# Patient Record
Sex: Female | Born: 1995 | Race: Black or African American | Hispanic: No | Marital: Single | Smoking: Never smoker
Health system: Southern US, Community
[De-identification: ages and names within clinical notes are randomized; demographics above are authoritative.]

## PROBLEM LIST (undated history)

## (undated) DIAGNOSIS — B009 Herpesviral infection, unspecified: Secondary | ICD-10-CM

---

## 2015-02-05 ENCOUNTER — Encounter: Payer: Self-pay | Admitting: Emergency Medicine

## 2015-02-05 ENCOUNTER — Other Ambulatory Visit: Payer: Self-pay

## 2015-02-05 ENCOUNTER — Emergency Department
Admission: EM | Admit: 2015-02-05 | Discharge: 2015-02-05 | Disposition: A | Discharge status: Home / Self Care | Payer: Medicaid - Out of State | Attending: Emergency Medicine | Admitting: Emergency Medicine

## 2015-02-05 DIAGNOSIS — Z3202 Encounter for pregnancy test, result negative: Secondary | ICD-10-CM | POA: Diagnosis not present

## 2015-02-05 DIAGNOSIS — R42 Dizziness and giddiness: Secondary | ICD-10-CM | POA: Insufficient documentation

## 2015-02-05 HISTORY — DX: Herpesviral infection, unspecified: B00.9

## 2015-02-05 LAB — URINALYSIS COMPLETE WITH MICROSCOPIC (ARMC ONLY)
BILIRUBIN URINE: NEGATIVE
Glucose, UA: NEGATIVE mg/dL
HGB URINE DIPSTICK: NEGATIVE
KETONES UR: NEGATIVE mg/dL
LEUKOCYTES UA: NEGATIVE
Nitrite: NEGATIVE
PH: 6 (ref 5.0–8.0)
Protein, ur: NEGATIVE mg/dL
Specific Gravity, Urine: 1.025 (ref 1.005–1.030)

## 2015-02-05 LAB — CBC WITH DIFFERENTIAL/PLATELET
BASOS PCT: 1 %
Basophils Absolute: 0.1 10*3/uL (ref 0–0.1)
EOS ABS: 0 10*3/uL (ref 0–0.7)
EOS PCT: 0 %
HEMATOCRIT: 42.9 % (ref 35.0–47.0)
Hemoglobin: 14.2 g/dL (ref 12.0–16.0)
Lymphocytes Relative: 29 %
Lymphs Abs: 2.3 10*3/uL (ref 1.0–3.6)
MCH: 28 pg (ref 26.0–34.0)
MCHC: 33.1 g/dL (ref 32.0–36.0)
MCV: 84.7 fL (ref 80.0–100.0)
MONO ABS: 0.3 10*3/uL (ref 0.2–0.9)
MONOS PCT: 4 %
Neutro Abs: 5.3 10*3/uL (ref 1.4–6.5)
Neutrophils Relative %: 66 %
PLATELETS: 386 10*3/uL (ref 150–440)
RBC: 5.06 MIL/uL (ref 3.80–5.20)
RDW: 13.3 % (ref 11.5–14.5)
WBC: 8 10*3/uL (ref 3.6–11.0)

## 2015-02-05 LAB — COMPREHENSIVE METABOLIC PANEL
ALBUMIN: 4.3 g/dL (ref 3.5–5.0)
ALT: 10 U/L — ABNORMAL LOW (ref 14–54)
ANION GAP: 8 (ref 5–15)
AST: 18 U/L (ref 15–41)
Alkaline Phosphatase: 66 U/L (ref 38–126)
BILIRUBIN TOTAL: 0.5 mg/dL (ref 0.3–1.2)
BUN: 9 mg/dL (ref 6–20)
CO2: 23 mmol/L (ref 22–32)
Calcium: 9.4 mg/dL (ref 8.9–10.3)
Chloride: 107 mmol/L (ref 101–111)
Creatinine, Ser: 0.69 mg/dL (ref 0.44–1.00)
GFR calc Af Amer: >60 mL/min (ref >60)
GFR calc non Af Amer: >60 mL/min (ref >60)
GLUCOSE: 99 mg/dL (ref 65–99)
POTASSIUM: 4.1 mmol/L (ref 3.5–5.1)
Sodium: 138 mmol/L (ref 135–145)
TOTAL PROTEIN: 8.1 g/dL (ref 6.5–8.1)

## 2015-02-05 LAB — PREGNANCY, URINE: Preg Test, Ur: NEGATIVE

## 2015-02-05 MED ORDER — ONDANSETRON HCL 4 MG/2ML IJ SOLN
4.0000 mg | Freq: Once | INTRAMUSCULAR | Status: AC
  Administered 2015-02-05: 4 mg via INTRAVENOUS
  Filled 2015-02-05: qty 2

## 2015-02-05 MED ORDER — SODIUM CHLORIDE 0.9 % IV SOLN
Freq: Once | INTRAVENOUS | Status: AC
  Administered 2015-02-05: 11:00:00 via INTRAVENOUS

## 2015-02-05 NOTE — Discharge Instructions (Signed)

## 2015-02-05 NOTE — ED Provider Notes (Signed)
[facility] Emergency Department Provider Note     Time seen: ----------------------------------------- 10:23 AM on 02/05/2015 -----------------------------------------    I have reviewed the triage vital signs and the nursing notes.   HISTORY  Chief Complaint Emesis and Dizziness    HPI Paige Alexander is a 19 y.o. female who presents ER for vomiting and dizziness that started this morning when she woke up.Patient has not had a history of this before, missed her last menstrual cycle. By dizziness she means lightheadedness. Does not describe room spinning sensation. Vomiting started after the dizziness, no other complaints such as fever or diarrhea.   Past Medical History  Diagnosis Date  . HSV (herpes simplex virus) infection     There are no active problems to display for this patient.   History reviewed. No pertinent past surgical history.  Allergies Review of patient's allergies indicates no known allergies.  Social History Social History  Substance Use Topics  . Smoking status: Never Smoker   . Smokeless tobacco: None  . Alcohol Use: No   Review of Systems Constitutional: Negative for fever. Eyes: Negative for visual changes. ENT: Negative for sore throat. Cardiovascular: Negative for chest pain. Respiratory: Negative for shortness of breath. Gastrointestinal: Negative for abdominal pain, vomiting and diarrhea. Genitourinary: Negative for dysuria. Musculoskeletal: Negative for back pain. Skin: Negative for rash. Neurological: Negative for headaches, focal weakness or numbness.  10-point ROS otherwise negative.  ____________________________________________   PHYSICAL EXAM:  VITAL SIGNS: ED Triage Vitals  Enc Vitals Group     BP 02/05/15 1016 132/79 mmHg     Pulse Rate 02/05/15 1016 87     Resp 02/05/15 1016 20     Temp 02/05/15 1016 98.3 F (36.8 C)     Temp src --      SpO2 02/05/15 1016 99 %     Weight 02/05/15  1016 166 lb (75.297 kg)     Height 02/05/15 1016 5' (1.524 m)     Head Cir --      Peak Flow --      Pain Score 02/05/15 1017 0     Pain Loc --      Pain Edu? --      Excl. in GC? --     Constitutional: Alert and oriented. Well appearing and in no distress. Eyes: Conjunctivae are normal. PERRL. Normal extraocular movements. ENT   Head: Normocephalic and atraumatic.   Nose: No congestion/rhinnorhea.   Mouth/Throat: Mucous membranes are moist.   Neck: No stridor. Cardiovascular: Normal rate, regular rhythm. Normal and symmetric distal pulses are present in all extremities. No murmurs, rubs, or gallops. Respiratory: Normal respiratory effort without tachypnea nor retractions. Breath sounds are clear and equal bilaterally. No wheezes/rales/rhonchi. Gastrointestinal: Soft and nontender. No distention. No abdominal bruits.  Musculoskeletal: Nontender with normal range of motion in all extremities. No joint effusions.  No lower extremity tenderness nor edema. Neurologic:  Normal speech and language. No gross focal neurologic deficits are appreciated. Speech is normal. No gait instability. Skin:  Skin is warm, dry and intact. No rash noted. Psychiatric: Mood and affect are normal. Speech and behavior are normal. Patient exhibits appropriate insight and judgment. ____________________________________________  EKG: Interpreted by me. Normal sinus rhythm with a rate of 75 bpm, normal axis normal intervals. No evidence of hypertrophy or acute infarction.  ____________________________________________  ED COURSE:  Pertinent labs & imaging results that were available during my care of the patient were reviewed by me and considered in my medical  decision making (see chart for details). Patient's distress, will check basic labs and give a liter fluid bolus ____________________________________________    LABS (pertinent positives/negatives)  Labs Reviewed  COMPREHENSIVE METABOLIC  PANEL - Abnormal; Notable for the following:    ALT 10 (*)    All other components within normal limits  URINALYSIS COMPLETEWITH MICROSCOPIC ([facility] ONLY) - Abnormal; Notable for the following:    Color, Urine YELLOW (*)    APPearance CLEAR (*)    Bacteria, UA RARE (*)    Squamous Epithelial / LPF 0-5 (*)    All other components within normal limits  CBC WITH DIFFERENTIAL/PLATELET  PREGNANCY, URINE    ____________________________________________  FINAL ASSESSMENT AND PLAN  Dizziness  Plan: Patient with labs as dictated above. Labs are unremarkable, patient received a liter saline here. She is stable for outpatient follow-up with her doctor.   Emily Filbert, MD   Emily Filbert, MD 02/05/15 [phone]

## 2015-02-05 NOTE — ED Notes (Signed)
Pt to ed with c/o vomiting and dizziness that started this am when she awoke.  Pt alert and oriented and skin warm and dry.

## 2015-02-13 LAB — POCT PREGNANCY, URINE: PREG TEST UR: NEGATIVE

## 2015-03-15 ENCOUNTER — Encounter: Payer: Self-pay | Admitting: Emergency Medicine

## 2015-03-15 ENCOUNTER — Emergency Department
Admission: EM | Admit: 2015-03-15 | Discharge: 2015-03-15 | Disposition: A | Discharge status: Home / Self Care | Payer: Medicaid - Out of State | Attending: Emergency Medicine | Admitting: Emergency Medicine

## 2015-03-15 DIAGNOSIS — F329 Major depressive disorder, single episode, unspecified: Secondary | ICD-10-CM | POA: Insufficient documentation

## 2015-03-15 DIAGNOSIS — Z3202 Encounter for pregnancy test, result negative: Secondary | ICD-10-CM | POA: Insufficient documentation

## 2015-03-15 DIAGNOSIS — G4700 Insomnia, unspecified: Secondary | ICD-10-CM

## 2015-03-15 DIAGNOSIS — R112 Nausea with vomiting, unspecified: Secondary | ICD-10-CM | POA: Insufficient documentation

## 2015-03-15 DIAGNOSIS — R45851 Suicidal ideations: Secondary | ICD-10-CM | POA: Diagnosis present

## 2015-03-15 DIAGNOSIS — F321 Major depressive disorder, single episode, moderate: Secondary | ICD-10-CM

## 2015-03-15 DIAGNOSIS — F32A Depression, unspecified: Secondary | ICD-10-CM

## 2015-03-15 DIAGNOSIS — F4310 Post-traumatic stress disorder, unspecified: Secondary | ICD-10-CM

## 2015-03-15 LAB — COMPREHENSIVE METABOLIC PANEL
ALBUMIN: 4.5 g/dL (ref 3.5–5.0)
ALT: 11 U/L — AB (ref 14–54)
AST: 17 U/L (ref 15–41)
Alkaline Phosphatase: 85 U/L (ref 38–126)
Anion gap: 11 (ref 5–15)
BILIRUBIN TOTAL: 0.7 mg/dL (ref 0.3–1.2)
BUN: 8 mg/dL (ref 6–20)
CHLORIDE: 104 mmol/L (ref 101–111)
CO2: 22 mmol/L (ref 22–32)
Calcium: 9.5 mg/dL (ref 8.9–10.3)
Creatinine, Ser: 0.78 mg/dL (ref 0.44–1.00)
GFR calc Af Amer: >60 mL/min (ref >60)
GFR calc non Af Amer: >60 mL/min (ref >60)
GLUCOSE: 105 mg/dL — AB (ref 65–99)
POTASSIUM: 3.5 mmol/L (ref 3.5–5.1)
Sodium: 137 mmol/L (ref 135–145)
TOTAL PROTEIN: 8.1 g/dL (ref 6.5–8.1)

## 2015-03-15 LAB — URINE DRUG SCREEN, QUALITATIVE (ARMC ONLY)
AMPHETAMINES, UR SCREEN: NOT DETECTED
Barbiturates, Ur Screen: NOT DETECTED
Benzodiazepine, Ur Scrn: NOT DETECTED
COCAINE METABOLITE, UR ~~LOC~~: NOT DETECTED
Cannabinoid 50 Ng, Ur ~~LOC~~: NOT DETECTED
MDMA (ECSTASY) UR SCREEN: NOT DETECTED
Methadone Scn, Ur: NOT DETECTED
OPIATE, UR SCREEN: NOT DETECTED
PHENCYCLIDINE (PCP) UR S: NOT DETECTED
Tricyclic, Ur Screen: NOT DETECTED

## 2015-03-15 LAB — ACETAMINOPHEN LEVEL: Acetaminophen (Tylenol), Serum: 22 ug/mL (ref 10–30)

## 2015-03-15 LAB — CBC
HEMATOCRIT: 40.9 % (ref 35.0–47.0)
Hemoglobin: 13.4 g/dL (ref 12.0–16.0)
MCH: 27.7 pg (ref 26.0–34.0)
MCHC: 32.8 g/dL (ref 32.0–36.0)
MCV: 84.6 fL (ref 80.0–100.0)
Platelets: 456 10*3/uL — ABNORMAL HIGH (ref 150–440)
RBC: 4.83 MIL/uL (ref 3.80–5.20)
RDW: 13.1 % (ref 11.5–14.5)
WBC: 7.5 10*3/uL (ref 3.6–11.0)

## 2015-03-15 LAB — POCT PREGNANCY, URINE: Preg Test, Ur: NEGATIVE

## 2015-03-15 LAB — ETHANOL: Alcohol, Ethyl (B): <5 mg/dL (ref <5)

## 2015-03-15 LAB — SALICYLATE LEVEL: Salicylate Lvl: <4 mg/dL (ref 2.8–30.0)

## 2015-03-15 MED ORDER — DIAZEPAM 5 MG PO TABS
5.0000 mg | ORAL_TABLET | Freq: Once | ORAL | Status: AC
  Administered 2015-03-15: 5 mg via ORAL
  Filled 2015-03-15: qty 1

## 2015-03-15 MED ORDER — TRAZODONE HCL 50 MG PO TABS: 50.0000 mg | ORAL_TABLET | Freq: Every evening | ORAL | Status: DC | PRN

## 2015-03-15 NOTE — ED Notes (Signed)

## 2015-03-15 NOTE — ED Provider Notes (Signed)
[facility] Emergency Department Provider Note     Time seen:----------------------------------------- 12:27 PM on 03/15/2015 -----------------------------------------    I have reviewed the triage vital signs and the nursing notes.   HISTORY  Chief Complaint sucicidal ideations  and Emesis    HPI Paige Alexander is a 19 y.o. female who presents ER for multiple complaints. Patient states she was vomiting this morning and having blurry vision. He may have been blood in her vomit, triage she noted that she was having suicidal thoughts because her significant other was having a child with another woman and was going to leave her. She was feeling suicidal but had not made a specific plan. She is very tearful.   Past Medical History  Diagnosis Date  . HSV (herpes simplex virus) infection     There are no active problems to display for this patient.   History reviewed. No pertinent past surgical history.  Allergies Review of patient's allergies indicates no known allergies.  Social History Social History  Substance Use Topics  . Smoking status: Never Smoker   . Smokeless tobacco: None  . Alcohol Use: No    Review of Systems Constitutional: Negative for fever. Eyes: Negative for visual changes. ENT: Negative for sore throat. Cardiovascular: Negative for chest pain. Respiratory: Negative for shortness of breath. Gastrointestinal: Negative for abdominal pain, positive for vomiting Genitourinary: Negative for dysuria. Musculoskeletal: Negative for back pain. Skin: Negative for rash. Neurological: Negative for headaches, focal weakness or numbness. Psychiatric: Positive for suicidal thought, depression  10-point ROS otherwise negative.  ____________________________________________   PHYSICAL EXAM:  VITAL SIGNS: ED Triage Vitals  Enc Vitals Group     BP --      Pulse Rate 03/15/15 1206 94     Resp 03/15/15 1206 18     Temp 03/15/15  1206 98.5 F (36.9 C)     Temp Source 03/15/15 1206 Oral     SpO2 03/15/15 1206 96 %     Weight 03/15/15 1206 164 lb (74.39 kg)     Height 03/15/15 1206 5' (1.524 m)     Head Cir --      Peak Flow --      Pain Score --      Pain Loc --      Pain Edu? --      Excl. in GC? --     Constitutional: Alert and oriented. Patient very tearful and upset. Eyes: Conjunctivae are normal. PERRL. Normal extraocular movements. ENT   Head: Normocephalic and atraumatic.   Nose: No congestion/rhinnorhea.   Mouth/Throat: Mucous membranes are moist.   Neck: No stridor. Cardiovascular: Normal rate, regular rhythm. Normal and symmetric distal pulses are present in all extremities. No murmurs, rubs, or gallops. Respiratory: Normal respiratory effort without tachypnea nor retractions. Breath sounds are clear and equal bilaterally. No wheezes/rales/rhonchi. Gastrointestinal: Soft and nontender. No distention. No abdominal bruits.  Musculoskeletal: Nontender with normal range of motion in all extremities. No joint effusions.  No lower extremity tenderness nor edema. Neurologic:  Normal speech and language. No gross focal neurologic deficits are appreciated. Speech is normal. No gait instability. Skin:  Skin is warm, dry and intact. No rash noted. Psychiatric: Mood and affect are normal. Speech and behavior are normal. Patient exhibits appropriate insight and judgment.  ____________________________________________  ED COURSE:  Pertinent labs & imaging results that were available during my care of the patient were reviewed by me and considered in my medical decision making (see chart for details). Patient  appears to be having adjustment disorder with depressed mood. She being given some by mouth Valium at this time we'll reevaluate. ____________________________________________    LABS (pertinent positives/negatives)  Labs Reviewed  CBC - Abnormal; Notable for the following:    Platelets 456  (*)    All other components within normal limits  COMPREHENSIVE METABOLIC PANEL  ETHANOL  SALICYLATE LEVEL  ACETAMINOPHEN LEVEL  URINE DRUG SCREEN, QUALITATIVE ([facility] ONLY)  POCT PREGNANCY, URINE   ___________________________________________  FINAL ASSESSMENT AND PLAN  Suicidal ideation  Plan: Patient with labs and imaging as dictated above. Patient is feeling better, she been seen by psychiatrist will place her on trazodone. She is not suicidal or homicidal this time. She is stable for discharge. She is being referred as an outpatient   Emily Filbert, MD   Emily Filbert, MD 03/15/15 [phone]

## 2015-03-15 NOTE — ED Notes (Signed)
BEHAVIORAL HEALTH ROUNDING Patient sleeping: No. Patient alert and oriented: yes Behavior appropriate: Yes.  ; If no, describe:  Nutrition and fluids offered: Yes  Toileting and hygiene offered: Yes  Sitter present: not applicable Law enforcement present: Yes  

## 2015-03-15 NOTE — ED Notes (Signed)
Pt discharged home after verbalizing understanding of discharge instructions; nad noted. 

## 2015-03-15 NOTE — ED Notes (Signed)
BEHAVIORAL HEALTH ROUNDING Patient sleeping: No. Patient alert and oriented: yes Behavior appropriate: Yes.  ; If no, describe:  Nutrition and fluids offered: Yes  Toileting and hygiene offered: Yes  Sitter present: not applicable Law enforcement present: No   

## 2015-03-15 NOTE — Discharge Instructions (Signed)
Depression °Depression refers to feeling sad, low, down in the dumps, blue, gloomy, or empty. In general, there are two kinds of depression: °· Normal sadness or normal grief. This kind of depression is one that we all feel from time to time after upsetting life experiences, such as the loss of a job or the ending of a relationship. This kind of depression is considered normal, is short lived, and resolves within a few days to 2 weeks. Depression experienced after the loss of a loved one (bereavement) often lasts longer than 2 weeks but normally gets better with time. °· Clinical depression. This kind of depression lasts longer than normal sadness or normal grief or interferes with your ability to function at home, at work, and in school. It also interferes with your personal relationships. It affects almost every aspect of your life. Clinical depression is an illness. °Symptoms of depression can also be caused by conditions other than those mentioned above, such as: °· Physical illness. Some physical illnesses, including underactive thyroid gland (hypothyroidism), severe anemia, specific types of cancer, diabetes, uncontrolled seizures, heart and lung problems, strokes, and chronic pain are commonly associated with symptoms of depression. °· Side effects of some prescription medicine. In some people, certain types of medicine can cause symptoms of depression. °· Substance abuse. Abuse of alcohol and illicit drugs can cause symptoms of depression. °SYMPTOMS °Symptoms of normal sadness and normal grief include the following: °· Feeling sad or crying for short periods of time. °· Not caring about anything (apathy). °· Difficulty sleeping or sleeping too much. °· No longer able to enjoy the things you used to enjoy. °· Desire to be by oneself all the time (social isolation). °· Lack of energy or motivation. °· Difficulty concentrating or remembering. °· Change in appetite or weight. °· Restlessness or  agitation. °Symptoms of clinical depression include the same symptoms of normal sadness or normal grief and also the following symptoms: °· Feeling sad or crying all the time. °· Feelings of guilt or worthlessness. °· Feelings of hopelessness or helplessness. °· Thoughts of suicide or the desire to harm yourself (suicidal ideation). °· Loss of touch with reality (psychotic symptoms). Seeing or hearing things that are not real (hallucinations) or having false beliefs about your life or the people around you (delusions and paranoia). °DIAGNOSIS  °The diagnosis of clinical depression is usually based on how bad the symptoms are and how long they have lasted. Your health care provider will also ask you questions about your medical history and substance use to find out if physical illness, use of prescription medicine, or substance abuse is causing your depression. Your health care provider may also order blood tests. °TREATMENT  °Often, normal sadness and normal grief do not require treatment. However, sometimes antidepressant medicine is given for bereavement to ease the depressive symptoms until they resolve. °The treatment for clinical depression depends on how bad the symptoms are but often includes antidepressant medicine, counseling with a mental health professional, or both. Your health care provider will help to determine what treatment is best for you. °Depression caused by physical illness usually goes away with appropriate medical treatment of the illness. If prescription medicine is causing depression, talk with your health care provider about stopping the medicine, decreasing the dose, or changing to another medicine. °Depression caused by the abuse of alcohol or illicit drugs goes away when you stop using these substances. Some adults need professional help in order to stop drinking or using drugs. °SEEK IMMEDIATE MEDICAL   CARE IF: °· You have thoughts about hurting yourself or others. °· You lose touch  with reality (have psychotic symptoms). °· You are taking medicine for depression and have a serious side effect. °FOR MORE INFORMATION °· National Alliance on Mental Illness: www.nami.org  °· National Institute of Mental Health: www.nimh.nih.gov  °Document Released: 05/26/2000 Document Revised: 10/13/2013 Document Reviewed: 08/28/2011 °ExitCare® Patient Information ©2015 [employer]. This information is not intended to replace advice given to you by your health care provider. Make sure you discuss any questions you have with your health care provider. ° °Suicidal Feelings, How to Help Yourself °Everyone feels sad or unhappy at times, but depressing thoughts and feelings of hopelessness can lead to thoughts of suicide. It can seem as if life is too tough to handle. If you feel as though you have reached the point where suicide is the only answer, it is time to let someone know immediately.  °HOW TO COPE AND PREVENT SUICIDE °· Let family, friends, teachers, or counselors know. Get help. Try not to isolate yourself from those who care about you. Even though you may not feel sociable, talk with someone every day. It is best if it is face-to-face. Remember, they will want to help you. °· Eat a regularly spaced and well-balanced diet. °· Get plenty of rest. °· Avoid alcohol and drugs because they will only make you feel worse and may also lower your inhibitions. Remove them from the home. If you are thinking of taking an overdose of your prescribed medicines, give your medicines to someone who can give them to you one day at a time. If you are on antidepressants, let your caregiver know of your feelings so he or she can provide a safer medicine, if that is a concern. °· Remove weapons or poisons from your home. °· Try to stick to routines. Follow a schedule and remind yourself that you have to keep that schedule every day. °· Set some realistic goals and achieve them. Make a list and cross things off as you go.  Accomplishments give a sense of worth. Wait until you are feeling better before doing things you find difficult or unpleasant to do. °· If you are able, try to start exercising. Even half-hour periods of exercise each day will make you feel better. Getting out in the sun or into nature helps you recover from depression faster. If you have a favorite place to walk, take advantage of that. °· Increase safe activities that have always given you pleasure. This may include playing your favorite music, reading a good book, painting a picture, or playing your favorite instrument. Do whatever takes your mind off your depression. °· Keep your living space well-lighted. °GET HELP °Contact a suicide hotline, crisis center, or local suicide prevention center for help right away. Local centers may include a hospital, clinic, community service organization, social service provider, or health department. °· Call your local emergency services (911 in the [place]). °· Call a suicide hotline: °¨ 1-800-273-TALK (1-800-273-8255) in the [place]. °¨ 1-800-SUICIDE (1-800-784-2433) in the [place]. °¨ 1-888-628-9454 in the [place] for Spanish-speaking counselors. °¨ 1-800-799-4TTY (1-800-799-4889) in the [place] for TTY users. °· Visit the following websites for information and help: °¨ National Suicide Prevention Lifeline: www.suicidepreventionlifeline.org °¨ Hopeline: www.hopeline.com °¨ American Foundation for Suicide Prevention: www.afsp.org °· For lesbian, gay, bisexual, transgender, or questioning youth, contact [employer]: °¨ 1-866-4-U-TREVOR (1-866-488-7386) in the [place]. °¨ www.thetrevorproject.org °· In [place], treatment resources are listed in each   province with listings available under [employer] or similar titles. Another source for Crisis Centres by Province is located at  http://www.suicideprevention.ca/in-crisis-now/find-a-crisis-centre-now/crisis-centres °Document Released: 12/03/2002 Document Revised: 08/21/2011 Document Reviewed: 09/23/2013 °ExitCare® Patient Information ©2015 [employer]. This information is not intended to replace advice given to you by your health care provider. Make sure you discuss any questions you have with your health care provider. ° °
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
## 2015-03-15 NOTE — ED Notes (Signed)
Patient calmer, denies SI, states would like to speak to MD re reeval for discharge.

## 2015-03-15 NOTE — ED Notes (Addendum)
Pt states she has been vomiting blood since this am and she is having some "blurred vision", denies any pain, states she also had some diarrhea last night, while in triage pt verbalizes SI with no plan, pt has child with her present, calling family to get child at this time

## 2015-03-15 NOTE — ED Notes (Signed)
Pt signature did not take due to error in [facility]. Had pt sign copy of discharge papers.

## 2016-04-17 ENCOUNTER — Encounter: Payer: Self-pay | Admitting: Emergency Medicine

## 2016-04-17 ENCOUNTER — Emergency Department
Admission: EM | Admit: 2016-04-17 | Discharge: 2016-04-17 | Disposition: A | Discharge status: Home / Self Care | Payer: PRIVATE HEALTH INSURANCE | Attending: Emergency Medicine | Admitting: Emergency Medicine

## 2016-04-17 DIAGNOSIS — N10 Acute tubulo-interstitial nephritis: Secondary | ICD-10-CM

## 2016-04-17 DIAGNOSIS — Z79899 Other long term (current) drug therapy: Secondary | ICD-10-CM | POA: Insufficient documentation

## 2016-04-17 LAB — URINALYSIS COMPLETE WITH MICROSCOPIC (ARMC ONLY)
Bacteria, UA: NONE SEEN
Bilirubin Urine: NEGATIVE
GLUCOSE, UA: NEGATIVE mg/dL
Ketones, ur: NEGATIVE mg/dL
Nitrite: NEGATIVE
Protein, ur: 30 mg/dL — AB
SPECIFIC GRAVITY, URINE: 1.015 (ref 1.005–1.030)
pH: 6 (ref 5.0–8.0)

## 2016-04-17 LAB — POCT PREGNANCY, URINE: PREG TEST UR: NEGATIVE

## 2016-04-17 MED ORDER — CEPHALEXIN 500 MG PO CAPS
500.0000 mg | ORAL_CAPSULE | Freq: Once | ORAL | Status: AC
  Administered 2016-04-17: 500 mg via ORAL
  Filled 2016-04-17: qty 1

## 2016-04-17 MED ORDER — CEPHALEXIN 500 MG PO CAPS: 500.0000 mg | ORAL_CAPSULE | Freq: Four times a day (QID) | ORAL | 0 refills | Status: DC

## 2016-04-17 NOTE — ED Provider Notes (Signed)
[facility] Emergency Department Provider Note   ____________________________________________   First MD Initiated Contact with Patient 04/17/16 2110     (approximate)  I have reviewed the triage vital signs and the nursing notes.   HISTORY  Chief Complaint Back Pain    HPI Paige Alexander is a 20 y.o. female reports that she's been having discomfort in her left lower back for the last 2 days, also began feeling increased urinary frequency but in small amounts any burning discomfort with urination. She reports she's been trying cranberry but is not assisted her, comes today feels as though she has a "urinary tract infection".  Denies significant prior medical history. She doesn't history of HSV and takes bicycle earlier for suppression at this time only.  Denies pregnancy vaginal discharge or abdominal pain. No nausea or vomiting though she did felt slight chills today. Currently reports a mild to moderate left-sided discomfort in the back "kidney area".  No chest pain or shortness of breath  Past Medical History:  Diagnosis Date  . HSV (herpes simplex virus) infection     Patient Active Problem List   Diagnosis Date Noted  . Depression, major, single episode, moderate ([facility]) 03/15/2015  . PTSD (post-traumatic stress disorder) 03/15/2015  . Insomnia 03/15/2015    History reviewed. No pertinent surgical history.  Prior to Admission medications   Medication Sig Start Date End Date Taking? Authorizing Provider  acyclovir (ZOVIRAX) 400 MG tablet Take 400 mg by mouth 2 (two) times daily.    Historical Provider, MD  cephALEXin (KEFLEX) 500 MG capsule Take 1 capsule (500 mg total) by mouth 4 (four) times daily. 04/17/16   Sharyn CreamerMark Teofila Bowery, MD  drospirenone-ethinyl estradiol (ZARAH) 3-0.03 MG tablet Take 1 tablet by mouth daily.    Historical Provider, MD  traZODone (DESYREL) 50 MG tablet Take 1 tablet (50 mg total) by mouth at bedtime as needed for sleep.  03/15/15   Audery AmelJohn T Clapacs, MD    Allergies Patient has no known allergies.  No family history on file.  Social History Social History  Substance Use Topics  . Smoking status: Never Smoker  . Smokeless tobacco: Never Used  . Alcohol use No    Review of Systems Constitutional: No fever. No weakness Eyes: No visual changes. ENT: No sore throat. Cardiovascular: Denies chest pain. Respiratory: Denies shortness of breath. Gastrointestinal: No abdominal pain.  No nausea, no vomiting.  No diarrhea.  No constipation. Genitourinary: See history of present illness Musculoskeletal: Negative for back pain except over the left flank. Skin: Negative for rash. Neurological: Negative for headaches, focal weakness or numbness.  10-point ROS otherwise negative.  ____________________________________________   PHYSICAL EXAM:  VITAL SIGNS: ED Triage Vitals  Enc Vitals Group     BP 04/17/16 2027 130/82     Pulse Rate 04/17/16 2027 99     Resp 04/17/16 2027 18     Temp 04/17/16 2027 98.1 F (36.7 C)     Temp Source 04/17/16 2027 Oral     SpO2 04/17/16 2027 99 %     Weight 04/17/16 2027 173 lb (78.5 kg)     Height 04/17/16 2027 5' (1.524 m)     Head Circumference --      Peak Flow --      Pain Score 04/17/16 2033 9     Pain Loc --      Pain Edu? --      Excl. in GC? --     Constitutional: Alert and  oriented. Well appearing and in no acute distress. Eyes: Conjunctivae are normal. PERRL. EOMI. Head: Atraumatic. Nose: No congestion/rhinnorhea. Mouth/Throat: Mucous membranes are moist. Neck: No stridor.   Cardiovascular: Normal rate, regular rhythm. Grossly normal heart sounds.  Good peripheral circulation. Respiratory: Normal respiratory effort.  No retractions. Lungs CTAB. Gastrointestinal: Soft and nontender. No distention. No pain at McBurney's point. Negative Murphy. Patient does have mild left CVA tenderness, no CVA tenderness on the right. Mild discomfort to palpation  suprapubically Musculoskeletal: No lower extremity tenderness nor edema.   Neurologic:  Normal speech and language. No gross focal neurologic deficits are appreciated. No gait instability. Skin:  Skin is warm, dry and intact. No rash noted. Psychiatric: Mood and affect are normal. Speech and behavior are normal.  ____________________________________________   LABS (all labs ordered are listed, but only abnormal results are displayed)  Labs Reviewed  URINALYSIS COMPLETEWITH MICROSCOPIC ([facility] ONLY) - Abnormal; Notable for the following:       Result Value   Color, Urine YELLOW (*)    APPearance CLOUDY (*)    Hgb urine dipstick 1+ (*)    Protein, ur 30 (*)    Leukocytes, UA 3+ (*)    Squamous Epithelial / LPF 0-5 (*)    All other components within normal limits  URINE CULTURE  POC URINE PREG, ED  POCT PREGNANCY, URINE   ____________________________________________  EKG   ____________________________________________  RADIOLOGY   ____________________________________________   PROCEDURES  Procedure(s) performed: None  Procedures  Critical Care performed: No  ____________________________________________   INITIAL IMPRESSION / ASSESSMENT AND PLAN / ED COURSE  Pertinent labs & imaging results that were available during my care of the patient were reviewed by me and considered in my medical decision making (see chart for details).  Awake alert nontoxic. Dysuria, left flank pain, clinical examination history and urinalysis suggest urinary tract infection and trouble mild left-sided pyelonephritis. No significant risk factors for immunosuppression.  Hemodynamically stable, nontoxic and well-appearing. Appears appropriate for outpatient antibiotic. Discussed with the patient, she is very agreeable we'll treat with cephalexin. Recommended close follow-up, and carefully advised on return precautions.  Return precautions and treatment recommendations and follow-up discussed  with the patient who is agreeable with the plan.   Clinical Course      ____________________________________________   FINAL CLINICAL IMPRESSION(S) / ED DIAGNOSES  Final diagnoses:  Pyelonephritis, acute      NEW MEDICATIONS STARTED DURING THIS VISIT:  New Prescriptions   CEPHALEXIN (KEFLEX) 500 MG CAPSULE    Take 1 capsule (500 mg total) by mouth 4 (four) times daily.     Note:  This document was prepared using Dragon voice recognition software and may include unintentional dictation errors.     Sharyn CreamerMark Huston Stonehocker, MD 04/17/16 2216

## 2016-04-17 NOTE — ED Triage Notes (Signed)
Pt ambulatory to triage with steady gait with c/o lower back pain since this morning. Pt reports she believes she had UTI for 2-3 days and is taking cranberry pills for symptoms. Pt reports increased urinary frequency and dysuria. Pt denies nausea,vomiting, or hematuria.

## 2016-04-17 NOTE — Discharge Instructions (Signed)
Your workup today suggests that you have a urinary tract infection (UTI) which has spread to your kidneys.  Please take your antibiotic as prescribed and over-the-counter pain medication (Tylenol or Motrin) as needed, but no more than recommended on the label instructions.  Drink PLENTY of fluids.  Call your regular doctor to schedule the next available appointment to follow up on today's ED visit, or return immediately to the ED if your pain worsens, you have decreased urine production, develop fever, persistent vomiting, or other symptoms that concern you.  

## 2016-04-17 NOTE — ED Notes (Signed)
Pt assessed by Dr. Fanny BienQuale.

## 2016-04-20 LAB — URINE CULTURE
Culture: >=100000 — AB
SPECIAL REQUESTS: NORMAL

## 2016-04-21 NOTE — Progress Notes (Addendum)
ED Culture Results   Allergies: NKDA Visit Date: 04/17/16 Chief Complaint: back pain Culture Type: Urine Culture Results: >100,000 staphylococcus species (coagulase negative) Original Abx given: Cephalexin Original Abx sensitive, intermediate, or resistant: resistant to oxacillin Recommended Abx: Bactrim DS 1 tablet BID x7 days ED Physician: Phineas SemenGraydon Goodman Contacted Patient: Called patient at 11:26AM. No voicemail is set up on patients phone. Will have to call back at a later time.  Prescription Called into:   Rella LarveKelly Fuhrmann, PharmD Clinical Pharmacist- Resident 11:13 AM 04/21/2016

## 2016-07-20 ENCOUNTER — Emergency Department
Admission: EM | Admit: 2016-07-20 | Discharge: 2016-07-20 | Disposition: A | Discharge status: Home / Self Care | Payer: Medicaid - Out of State | Attending: Emergency Medicine | Admitting: Emergency Medicine

## 2016-07-20 DIAGNOSIS — N898 Other specified noninflammatory disorders of vagina: Secondary | ICD-10-CM | POA: Diagnosis present

## 2016-07-20 DIAGNOSIS — A5901 Trichomonal vulvovaginitis: Secondary | ICD-10-CM | POA: Diagnosis not present

## 2016-07-20 LAB — URINALYSIS, COMPLETE (UACMP) WITH MICROSCOPIC
BILIRUBIN URINE: NEGATIVE
Glucose, UA: NEGATIVE mg/dL
Hgb urine dipstick: NEGATIVE
KETONES UR: 5 mg/dL — AB
Nitrite: NEGATIVE
Protein, ur: 30 mg/dL — AB
SPECIFIC GRAVITY, URINE: 1.026 (ref 1.005–1.030)
pH: 5 (ref 5.0–8.0)

## 2016-07-20 LAB — WET PREP, GENITAL
Clue Cells Wet Prep HPF POC: NONE SEEN
Sperm: NONE SEEN
Yeast Wet Prep HPF POC: NONE SEEN

## 2016-07-20 LAB — POCT PREGNANCY, URINE: PREG TEST UR: NEGATIVE

## 2016-07-20 LAB — CHLAMYDIA/NGC RT PCR (ARMC ONLY)
CHLAMYDIA TR: NOT DETECTED
N GONORRHOEAE: NOT DETECTED

## 2016-07-20 MED ORDER — METRONIDAZOLE 500 MG PO TABS: 500.0000 mg | ORAL_TABLET | Freq: Two times a day (BID) | ORAL | 0 refills | Status: DC

## 2016-07-20 NOTE — ED Triage Notes (Signed)
Pt arrives to ER via POV c/o possible STD exposure. Pt reports discharge and foul odor. Pt reports that she thinks she may have gotten something from her husband. No pain.

## 2016-07-20 NOTE — Discharge Instructions (Signed)
Follow-up with [facility] for any continued sexually transmitted infections. Take Flagyl as directed. You cannot drink or take cough medication that contains alcohol while taking this medication.  Gonorrhea and chlamydia test are still pending at this time. You'll be informed if there is any positives. Have your partner tested at the health department.

## 2016-07-20 NOTE — ED Provider Notes (Signed)
[facility] Emergency Department Provider Note  ____________________________________________   First MD Initiated Contact with Patient 07/20/16 [phone]     (approximate)  I have reviewed the triage vital signs and the nursing notes.   HISTORY  Chief Complaint Vaginal Discharge (odor)    HPI Paige Alexander is a 21 y.o. female is here complaining of vaginal discharge with odor for approximately one week. Patient states that most likely she does have an STD exposure. She states that she is gotten infections from her husband in the past. She denies any nausea, vomiting, fever or chills. She is unaware that her partner has any symptoms at this time. Patient states that last time she was treated at another hospital and was positive for gonorrhea and  chlamydia. She states that her husband says he was treated.   Past Medical History:  Diagnosis Date  . HSV (herpes simplex virus) infection     Patient Active Problem List   Diagnosis Date Noted  . Depression, major, single episode, moderate (HCC) 03/15/2015  . PTSD (post-traumatic stress disorder) 03/15/2015  . Insomnia 03/15/2015    History reviewed. No pertinent surgical history.  Prior to Admission medications   Medication Sig Start Date End Date Taking? Authorizing Provider  acyclovir (ZOVIRAX) 400 MG tablet Take 400 mg by mouth 2 (two) times daily.    Historical Provider, MD  drospirenone-ethinyl estradiol (ZARAH) 3-0.03 MG tablet Take 1 tablet by mouth daily.    Historical Provider, MD  metroNIDAZOLE (FLAGYL) 500 MG tablet Take 1 tablet (500 mg total) by mouth 2 (two) times daily. 07/20/16   Tommi Rumpshonda L Merrillyn Ackerley, PA-C  traZODone (DESYREL) 50 MG tablet Take 1 tablet (50 mg total) by mouth at bedtime as needed for sleep. 03/15/15   Audery AmelJohn T Clapacs, MD    Allergies Patient has no known allergies.  No family history on file.  Social History Social History  Substance Use Topics  . Smoking status: Never  Smoker  . Smokeless tobacco: Never Used  . Alcohol use No    Review of Systems Constitutional: No fever/chills Cardiovascular: Denies chest pain. Respiratory: Denies shortness of breath. Gastrointestinal: No abdominal pain.  No nausea, no vomiting.   Genitourinary: Positive for dysuria. Positive for vaginal discharge. Musculoskeletal: Negative for back pain. Skin: Negative for rash. Neurological: Negative for headaches, focal weakness or numbness. Psychiatric:Positive major depression. Positive PTSD.  10-point ROS otherwise negative.  ____________________________________________   PHYSICAL EXAM:  VITAL SIGNS: ED Triage Vitals [07/20/16 0841]  Enc Vitals Group     BP 117/64     Pulse Rate 69     Resp 18     Temp 98.3 F (36.8 C)     Temp Source Oral     SpO2 100 %     Weight 150 lb (68 kg)     Height 5' (1.524 m)     Head Circumference      Peak Flow      Pain Score      Pain Loc      Pain Edu?      Excl. in GC?     Constitutional: Alert and oriented. Well appearing and in no acute distress. Eyes: Conjunctivae are normal. PERRL. EOMI. Head: Atraumatic. Nose: No congestion/rhinnorhea. Neck: No stridor.   Cardiovascular: Normal rate, regular rhythm. Grossly normal heart sounds.  Good peripheral circulation. Respiratory: Normal respiratory effort.  No retractions. Lungs CTAB. Gastrointestinal: Soft and nontender. No distention.  Genitourinary: On vaginal exam there is moderate) secretions  noted. There is no tenderness or masses noted bilateral adnexal areas. There is no tenderness on cervical motion. Musculoskeletal: Moves upper and lower extremities without difficulty. Normal gait was noted. Neurologic:  Normal speech and language. No gross focal neurologic deficits are appreciated. No gait instability. Skin:  Skin is warm, dry and intact. No rash noted. Psychiatric: Mood and affect are normal. Speech and behavior are  normal.  ____________________________________________   LABS (all labs ordered are listed, but only abnormal results are displayed)  Labs Reviewed  WET PREP, GENITAL - Abnormal; Notable for the following:       Result Value   Trich, Wet Prep PRESENT (*)    WBC, Wet Prep HPF POC MODERATE (*)    All other components within normal limits  URINALYSIS, COMPLETE (UACMP) WITH MICROSCOPIC - Abnormal; Notable for the following:    Color, Urine YELLOW (*)    APPearance CLOUDY (*)    Ketones, ur 5 (*)    Protein, ur 30 (*)    Leukocytes, UA LARGE (*)    Bacteria, UA RARE (*)    Squamous Epithelial / LPF 6-30 (*)    All other components within normal limits  CHLAMYDIA/NGC RT PCR ([facility] ONLY)  POC URINE PREG, ED  POCT PREGNANCY, URINE    PROCEDURES  Procedure(s) performed: None  Procedures  Critical Care performed: No  ____________________________________________   INITIAL IMPRESSION / ASSESSMENT AND PLAN / ED COURSE  Pertinent labs & imaging results that were available during my care of the patient were reviewed by me and considered in my medical decision making (see chart for details).  Patient was made aware that she does have Trichomonas on her wet prep. She is given a prescription for Flagyl 500 mg to take as directed. At time of discharge her Chlamydia and gonorrhea test was not available. Patient was made aware that if these are positive that she will be called. At this time we are only treating for the Trichomonas. She was given [facility] for both she and her husband to follow-up with if any continued      ____________________________________________   FINAL CLINICAL IMPRESSION(S) / ED DIAGNOSES  Final diagnoses:  Vaginitis due to Trichomonas      NEW MEDICATIONS STARTED DURING THIS VISIT:  Discharge Medication List as of 07/20/2016  9:57 AM    START taking these medications   Details  metroNIDAZOLE  (FLAGYL) 500 MG tablet Take 1 tablet (500 mg total) by mouth 2 (two) times daily., Starting Thu 07/20/2016, Print         Note:  This document was prepared using Dragon voice recognition software and may include unintentional dictation errors.    Tommi Rumps, PA-C 07/20/16 1200    Jeanmarie Plant, MD 07/20/16 1435

## 2016-09-15 ENCOUNTER — Emergency Department
Admission: EM | Admit: 2016-09-15 | Discharge: 2016-09-15 | Disposition: A | Discharge status: Home / Self Care | Payer: Medicaid - Out of State | Attending: Emergency Medicine | Admitting: Emergency Medicine

## 2016-09-15 ENCOUNTER — Encounter: Payer: Self-pay | Admitting: Emergency Medicine

## 2016-09-15 ENCOUNTER — Emergency Department: Payer: Medicaid - Out of State

## 2016-09-15 DIAGNOSIS — J069 Acute upper respiratory infection, unspecified: Secondary | ICD-10-CM | POA: Diagnosis not present

## 2016-09-15 DIAGNOSIS — R05 Cough: Secondary | ICD-10-CM | POA: Diagnosis present

## 2016-09-15 LAB — CBC WITH DIFFERENTIAL/PLATELET
Basophils Absolute: 0 10*3/uL (ref 0–0.1)
Basophils Relative: 1 %
EOS PCT: 2 %
Eosinophils Absolute: 0.1 10*3/uL (ref 0–0.7)
HCT: 41.4 % (ref 35.0–47.0)
Hemoglobin: 13.7 g/dL (ref 12.0–16.0)
LYMPHS ABS: 2.7 10*3/uL (ref 1.0–3.6)
Lymphocytes Relative: 34 %
MCH: 28.7 pg (ref 26.0–34.0)
MCHC: 33.1 g/dL (ref 32.0–36.0)
MCV: 86.6 fL (ref 80.0–100.0)
MONOS PCT: 12 %
Monocytes Absolute: 1 10*3/uL — ABNORMAL HIGH (ref 0.2–0.9)
Neutro Abs: 4.1 10*3/uL (ref 1.4–6.5)
Neutrophils Relative %: 51 %
PLATELETS: 309 10*3/uL (ref 150–440)
RBC: 4.78 MIL/uL (ref 3.80–5.20)
RDW: 13.7 % (ref 11.5–14.5)
WBC: 7.9 10*3/uL (ref 3.6–11.0)

## 2016-09-15 LAB — COMPREHENSIVE METABOLIC PANEL
ALT: 16 U/L (ref 14–54)
AST: 25 U/L (ref 15–41)
Albumin: 4 g/dL (ref 3.5–5.0)
Alkaline Phosphatase: 84 U/L (ref 38–126)
Anion gap: 13 (ref 5–15)
BUN: 13 mg/dL (ref 6–20)
CHLORIDE: 108 mmol/L (ref 101–111)
CO2: 19 mmol/L — AB (ref 22–32)
CREATININE: 1.05 mg/dL — AB (ref 0.44–1.00)
Calcium: 9.2 mg/dL (ref 8.9–10.3)
GFR calc Af Amer: >60 mL/min (ref >60)
GFR calc non Af Amer: >60 mL/min (ref >60)
Glucose, Bld: 93 mg/dL (ref 65–99)
Potassium: 4.3 mmol/L (ref 3.5–5.1)
SODIUM: 140 mmol/L (ref 135–145)
Total Bilirubin: 0.6 mg/dL (ref 0.3–1.2)
Total Protein: 7.8 g/dL (ref 6.5–8.1)

## 2016-09-15 LAB — URINALYSIS, COMPLETE (UACMP) WITH MICROSCOPIC
Bacteria, UA: NONE SEEN
Bilirubin Urine: NEGATIVE
GLUCOSE, UA: NEGATIVE mg/dL
Ketones, ur: NEGATIVE mg/dL
LEUKOCYTES UA: NEGATIVE
Nitrite: NEGATIVE
PH: 6 (ref 5.0–8.0)
Protein, ur: NEGATIVE mg/dL
SPECIFIC GRAVITY, URINE: 1.017 (ref 1.005–1.030)

## 2016-09-15 LAB — POCT PREGNANCY, URINE: Preg Test, Ur: NEGATIVE

## 2016-09-15 MED ORDER — AZITHROMYCIN 500 MG PO TABS
500.0000 mg | ORAL_TABLET | Freq: Once | ORAL | Status: AC
  Administered 2016-09-15: 500 mg via ORAL
  Filled 2016-09-15: qty 1

## 2016-09-15 MED ORDER — BENZONATATE 100 MG PO CAPS
100.0000 mg | ORAL_CAPSULE | Freq: Once | ORAL | Status: AC
  Administered 2016-09-15: 100 mg via ORAL
  Filled 2016-09-15: qty 1

## 2016-09-15 MED ORDER — HYDROCOD POLST-CPM POLST ER 10-8 MG/5ML PO SUER: 5.0000 mL | Freq: Two times a day (BID) | ORAL | 0 refills | Status: DC | PRN

## 2016-09-15 MED ORDER — AZITHROMYCIN 250 MG PO TABS: ORAL_TABLET | ORAL | 0 refills | Status: AC

## 2016-09-15 NOTE — ED Triage Notes (Signed)
Pt ambulatory to triage, reports headache, fever, cold sx including cough and nasal congestion and nausea since yesterday.  Afebrile in triage.

## 2016-09-15 NOTE — ED Provider Notes (Signed)
[facility] Emergency Department Provider Note    First MD Initiated Contact with Patient 09/15/16 [phone]     (approximate)  I have reviewed the triage vital signs and the nursing notes.   HISTORY  Chief Complaint Headache and Fever   HPI Paige Alexander is a 21 y.o. female low-dose of chronic medical conditions presents to the emergency department with cough congestion and fever frontal headache times one day. Patient afebrile on presentation temperature 98.6. Patient states that she attempted to go to work tonight however symptoms worsened which prompted her place of employment to refer to the emergency department. Patient denies any neck pain or stiffness. Patient denies any sore throat.   Past Medical History:  Diagnosis Date  . HSV (herpes simplex virus) infection     Patient Active Problem List   Diagnosis Date Noted  . Depression, major, single episode, moderate (HCC) 03/15/2015  . PTSD (post-traumatic stress disorder) 03/15/2015  . Insomnia 03/15/2015    History reviewed. No pertinent surgical history.  Prior to Admission medications   Medication Sig Start Date End Date Taking? Authorizing Provider  acyclovir (ZOVIRAX) 400 MG tablet Take 400 mg by mouth 2 (two) times daily.    Historical Provider, MD  drospirenone-ethinyl estradiol (ZARAH) 3-0.03 MG tablet Take 1 tablet by mouth daily.    Historical Provider, MD  metroNIDAZOLE (FLAGYL) 500 MG tablet Take 1 tablet (500 mg total) by mouth 2 (two) times daily. 07/20/16   Tommi Rumps, PA-C  traZODone (DESYREL) 50 MG tablet Take 1 tablet (50 mg total) by mouth at bedtime as needed for sleep. 03/15/15   Audery Amel, MD    Allergies Patient has no known allergies.  History reviewed. No pertinent family history.  Social History Social History  Substance Use Topics  . Smoking status: Never Smoker  . Smokeless tobacco: Never Used  . Alcohol use No    Review of Systems Constitutional:  Positive fever/chills Eyes: No visual changes. ENT: No sore throat. Positive for nasal congestion Cardiovascular: Denies chest pain. Respiratory: Denies shortness of breath. Positive for cough Gastrointestinal: No abdominal pain.  No nausea, no vomiting.  No diarrhea.  No constipation. Genitourinary: Negative for dysuria. Musculoskeletal: Negative for back pain. Skin: Negative for rash. Neurological: Negative for headaches, focal weakness or numbness.  10-point ROS otherwise negative.  ____________________________________________   PHYSICAL EXAM:  VITAL SIGNS: ED Triage Vitals [09/15/16 0056]  Enc Vitals Group     BP 114/81     Pulse Rate 85     Resp 18     Temp 98.6 F (37 C)     Temp Source Oral     SpO2 99 %     Weight 160 lb (72.6 kg)     Height 5' (1.524 m)     Head Circumference      Peak Flow      Pain Score 9     Pain Loc      Pain Edu?      Excl. in GC?     Constitutional: Alert and oriented. Well appearing and in no acute distress. Eyes: Conjunctivae are normal. PERRL. EOMI. Head: Atraumatic. Ears:  Healthy appearing ear canals and TMs bilaterally Nose: Positive for congestion/rhinnorhea. Mouth/Throat: Mucous membranes are moist.  Oropharynx non-erythematous. Neck: No stridor.  Positive anterior cervical lymphadenopathy Cardiovascular: Normal rate, regular rhythm. Good peripheral circulation. Grossly normal heart sounds. Respiratory: Normal respiratory effort.  No retractions. Lungs CTAB. Gastrointestinal: Soft and nontender. No distention.  Musculoskeletal: No lower extremity tenderness nor edema. No gross deformities of extremities. Neurologic:  Normal speech and language. No gross focal neurologic deficits are appreciated.  Skin:  Skin is warm, dry and intact. No rash noted.   ____________________________________________   LABS (all labs ordered are listed, but only abnormal results are displayed)  Labs Reviewed  COMPREHENSIVE METABOLIC PANEL -  Abnormal; Notable for the following:       Result Value   CO2 19 (*)    Creatinine, Ser 1.05 (*)    All other components within normal limits  CBC WITH DIFFERENTIAL/PLATELET - Abnormal; Notable for the following:    Monocytes Absolute 1.0 (*)    All other components within normal limits  URINALYSIS, COMPLETE (UACMP) WITH MICROSCOPIC - Abnormal; Notable for the following:    Color, Urine YELLOW (*)    APPearance CLEAR (*)    Hgb urine dipstick LARGE (*)    Squamous Epithelial / LPF 0-5 (*)    All other components within normal limits  POC URINE PREG, ED  POCT PREGNANCY, URINE    RADIOLOGY I, Rutherford N Brazos Sandoval, personally viewed and evaluated these images (plain radiographs) as part of my medical decision making, as well as reviewing the written report by the radiologist.  Dg Chest 2 View  Result Date: 09/15/2016 CLINICAL DATA:  Headache, fever and cold like symptoms with cough and nasal congestion. EXAM: CHEST  2 VIEW COMPARISON:  None. FINDINGS: The heart size and mediastinal contours are within normal limits. Both lungs are clear. The visualized skeletal structures are unremarkable. IMPRESSION: No active cardiopulmonary disease. Electronically Signed   By: Tollie Eth M.D.   On: 09/15/2016 01:38    Procedures   ____________________________________________   INITIAL IMPRESSION / ASSESSMENT AND PLAN / ED COURSE  Pertinent labs & imaging results that were available during my care of the patient were reviewed by me and considered in my medical decision making (see chart for details).  History physical exam consistent with upper respiratory tract infection with possible sinusitis.      ____________________________________________  FINAL CLINICAL IMPRESSION(S) / ED DIAGNOSES  Final diagnoses:  Upper respiratory tract infection, unspecified type     MEDICATIONS GIVEN DURING THIS VISIT:  Medications  benzonatate (TESSALON) capsule 100 mg (not administered)  azithromycin  (ZITHROMAX) tablet 500 mg (not administered)     NEW OUTPATIENT MEDICATIONS STARTED DURING THIS VISIT:  New Prescriptions   No medications on file    Modified Medications   No medications on file    Discontinued Medications   No medications on file     Note:  This document was prepared using Dragon voice recognition software and may include unintentional dictation errors.    Darci Current, MD 09/15/16 [phone]

## 2016-10-25 DIAGNOSIS — J0110 Acute frontal sinusitis, unspecified: Secondary | ICD-10-CM | POA: Diagnosis not present

## 2016-10-25 DIAGNOSIS — R0981 Nasal congestion: Secondary | ICD-10-CM | POA: Diagnosis present

## 2016-10-26 ENCOUNTER — Emergency Department
Admission: EM | Admit: 2016-10-26 | Discharge: 2016-10-26 | Disposition: A | Discharge status: Home / Self Care | Payer: Medicaid - Out of State | Attending: Emergency Medicine | Admitting: Emergency Medicine

## 2016-10-26 DIAGNOSIS — J0110 Acute frontal sinusitis, unspecified: Secondary | ICD-10-CM

## 2016-10-26 MED ORDER — AZITHROMYCIN 500 MG PO TABS
500.0000 mg | ORAL_TABLET | Freq: Once | ORAL | Status: AC
  Administered 2016-10-26: 500 mg via ORAL
  Filled 2016-10-26: qty 1

## 2016-10-26 MED ORDER — AZITHROMYCIN 250 MG PO TABS: 250.0000 mg | ORAL_TABLET | Freq: Every day | ORAL | 0 refills | Status: DC

## 2016-10-26 NOTE — ED Provider Notes (Signed)
[facility] Emergency Department Provider Note   ____________________________________________   First MD Initiated Contact with Patient 10/26/16 0216     (approximate)  I have reviewed the triage vital signs and the nursing notes.   HISTORY  Chief Complaint Nasal Congestion    HPI Paige Alexander is a 21 y.o. female who presents to the ED from home with a chief complaint of runny nose, congestion, sinus pressure, headache and right ear pain.Similar symptoms 1 month ago treated with cough medicine for URI. Denies associated fever, chills, chest pain, shortness of breath, abdominal pain, nausea, vomiting, diarrhea. Denies recent travel or trauma. Nothing makes her symptoms better or worse.   Past Medical History:  Diagnosis Date  . HSV (herpes simplex virus) infection     Patient Active Problem List   Diagnosis Date Noted  . Depression, major, single episode, moderate (HCC) 03/15/2015  . PTSD (post-traumatic stress disorder) 03/15/2015  . Insomnia 03/15/2015    No past surgical history on file.  Prior to Admission medications   Medication Sig Start Date End Date Taking? Authorizing Provider  acyclovir (ZOVIRAX) 400 MG tablet Take 400 mg by mouth 2 (two) times daily.    [provider]  chlorpheniramine-HYDROcodone (TUSSIONEX PENNKINETIC ER) 10-8 MG/5ML SUER Take 5 mLs by mouth every 12 (twelve) hours as needed. 09/15/16   Darci Current, MD  drospirenone-ethinyl estradiol (ZARAH) 3-0.03 MG tablet Take 1 tablet by mouth daily.    [provider]  metroNIDAZOLE (FLAGYL) 500 MG tablet Take 1 tablet (500 mg total) by mouth 2 (two) times daily. 07/20/16   Tommi Rumps, PA-C  traZODone (DESYREL) 50 MG tablet Take 1 tablet (50 mg total) by mouth at bedtime as needed for sleep. 03/15/15   Clapacs, Jackquline Denmark, MD    Allergies Patient has no known allergies.  No family history on file.  Social History Social History  Substance Use  Topics  . Smoking status: Never Smoker  . Smokeless tobacco: Never Used  . Alcohol use No    Review of Systems  Constitutional: No fever/chills. Eyes: No visual changes. ENT: Positive for nasal congestion, right earache, sinus pressure. No sore throat. Cardiovascular: Denies chest pain. Respiratory: Positive for nonproductive cough. Denies shortness of breath. Gastrointestinal: No abdominal pain.  No nausea, no vomiting.  No diarrhea.  No constipation. Genitourinary: Negative for dysuria. Musculoskeletal: Negative for back pain. Skin: Negative for rash. Neurological: Negative for headaches, focal weakness or numbness.   ____________________________________________   PHYSICAL EXAM:  VITAL SIGNS: ED Triage Vitals [10/26/16 0003]  Enc Vitals Group     BP 123/85     Pulse Rate 72     Resp 18     Temp 98.6 F (37 C)     Temp Source Oral     SpO2 100 %     Weight 150 lb (68 kg)     Height 5' (1.524 m)     Head Circumference      Peak Flow      Pain Score 9     Pain Loc      Pain Edu?      Excl. in GC?     Constitutional: Alert and oriented. Well appearing and in no acute distress. Eyes: Conjunctivae are normal. PERRL. EOMI. Head: Atraumatic. Tender to palpation frontal maxillary sinuses. Ears: Mild fluid behind bilateral TMs which are dull. Nose: Congestion/rhinnorhea. Mouth/Throat: Mucous membranes are moist.  Oropharynx non-erythematous. Neck: No stridor.  No cervical spine tenderness  to palpation. Supple neck without meningismus. Hematological/Lymphatic/Immunilogical: No cervical lymphadenopathy. Cardiovascular: Normal rate, regular rhythm. Grossly normal heart sounds.  Good peripheral circulation. Respiratory: Normal respiratory effort.  No retractions. Lungs CTAB. Gastrointestinal: Soft and nontender. No distention. No abdominal bruits. No CVA tenderness. Musculoskeletal: No lower extremity tenderness nor edema.  No joint effusions. Neurologic:  Normal speech  and language. No gross focal neurologic deficits are appreciated. No gait instability. Skin:  Skin is warm, dry and intact. No rash noted. No petechiae. Psychiatric: Mood and affect are normal. Speech and behavior are normal.  ____________________________________________   LABS (all labs ordered are listed, but only abnormal results are displayed)  Labs Reviewed - No data to display ____________________________________________  EKG  None ____________________________________________  RADIOLOGY  None ____________________________________________   PROCEDURES  Procedure(s) performed: None  Procedures  Critical Care performed: No  ____________________________________________   INITIAL IMPRESSION / ASSESSMENT AND PLAN / ED COURSE  Pertinent labs & imaging results that were available during my care of the patient were reviewed by me and considered in my medical decision making (see chart for details).  21 year old female who presents with sinus congestion, pressure, nonproductive cough. Will treat mild sinusitis with Z-Pak and patient will follow-up with her PCP as needed. Asking for work note. Strict return precautions given. Patient verbalizes understanding and agrees with plan of care.      ____________________________________________   FINAL CLINICAL IMPRESSION(S) / ED DIAGNOSES  Final diagnoses:  Acute frontal sinusitis, recurrence not specified      NEW MEDICATIONS STARTED DURING THIS VISIT:  New Prescriptions   No medications on file     Note:  This document was prepared using Dragon voice recognition software and may include unintentional dictation errors.    Irean HongSung, Jade J, MD 10/26/16 [phone]

## 2016-10-26 NOTE — ED Triage Notes (Signed)
Pt in with co runny nose, congestion, and sinus pressure for 2 days. Also co headache and right ear ache, had same symptoms 1 month ago and was treated for URI.

## 2016-10-26 NOTE — Discharge Instructions (Signed)
1. Finish antibiotic as prescribed (azithromycin 250 mg daily 4 days, start your next dose Friday morning). 2. Return to the ER for worsening symptoms, persistent vomiting, difficulty breathing or other concerns.

## 2016-11-08 DIAGNOSIS — R1110 Vomiting, unspecified: Secondary | ICD-10-CM | POA: Diagnosis present

## 2016-11-08 DIAGNOSIS — K529 Noninfective gastroenteritis and colitis, unspecified: Secondary | ICD-10-CM | POA: Diagnosis not present

## 2016-11-08 DIAGNOSIS — Z79899 Other long term (current) drug therapy: Secondary | ICD-10-CM | POA: Insufficient documentation

## 2016-11-08 LAB — COMPREHENSIVE METABOLIC PANEL
ALT: 15 U/L (ref 14–54)
AST: 21 U/L (ref 15–41)
Albumin: 4.3 g/dL (ref 3.5–5.0)
Alkaline Phosphatase: 84 U/L (ref 38–126)
Anion gap: 8 (ref 5–15)
BUN: 12 mg/dL (ref 6–20)
CHLORIDE: 106 mmol/L (ref 101–111)
CO2: 27 mmol/L (ref 22–32)
CREATININE: 0.69 mg/dL (ref 0.44–1.00)
Calcium: 9.3 mg/dL (ref 8.9–10.3)
GFR calc Af Amer: >60 mL/min (ref >60)
GFR calc non Af Amer: >60 mL/min (ref >60)
Glucose, Bld: 97 mg/dL (ref 65–99)
Potassium: 4.2 mmol/L (ref 3.5–5.1)
SODIUM: 141 mmol/L (ref 135–145)
Total Bilirubin: 0.6 mg/dL (ref 0.3–1.2)
Total Protein: 8.1 g/dL (ref 6.5–8.1)

## 2016-11-08 LAB — POCT PREGNANCY, URINE: Preg Test, Ur: NEGATIVE

## 2016-11-08 LAB — CBC
HCT: 43.3 % (ref 35.0–47.0)
Hemoglobin: 14.2 g/dL (ref 12.0–16.0)
MCH: 28.2 pg (ref 26.0–34.0)
MCHC: 32.7 g/dL (ref 32.0–36.0)
MCV: 86.3 fL (ref 80.0–100.0)
PLATELETS: 349 10*3/uL (ref 150–440)
RBC: 5.02 MIL/uL (ref 3.80–5.20)
RDW: 13.3 % (ref 11.5–14.5)
WBC: 7.9 10*3/uL (ref 3.6–11.0)

## 2016-11-08 LAB — URINALYSIS, COMPLETE (UACMP) WITH MICROSCOPIC
Bacteria, UA: NONE SEEN
Bilirubin Urine: NEGATIVE
Glucose, UA: NEGATIVE mg/dL
Hgb urine dipstick: NEGATIVE
KETONES UR: NEGATIVE mg/dL
Nitrite: NEGATIVE
PH: 7 (ref 5.0–8.0)
Protein, ur: NEGATIVE mg/dL
SPECIFIC GRAVITY, URINE: 1.026 (ref 1.005–1.030)

## 2016-11-08 LAB — LIPASE, BLOOD: LIPASE: 45 U/L (ref 11–51)

## 2016-11-08 NOTE — ED Triage Notes (Signed)
NVD that began 1 hour PTA. Pt alert and oriented X4, active, cooperative, pt in NAD. RR even and unlabored, color WNL.

## 2016-11-09 ENCOUNTER — Emergency Department
Admission: EM | Admit: 2016-11-09 | Discharge: 2016-11-09 | Disposition: A | Discharge status: Home / Self Care | Payer: Medicaid - Out of State | Attending: Emergency Medicine | Admitting: Emergency Medicine

## 2016-11-09 DIAGNOSIS — K529 Noninfective gastroenteritis and colitis, unspecified: Secondary | ICD-10-CM

## 2016-11-09 MED ORDER — SODIUM CHLORIDE 0.9 % IV BOLUS (SEPSIS)
1000.0000 mL | Freq: Once | INTRAVENOUS | Status: AC
  Administered 2016-11-09: 1000 mL via INTRAVENOUS

## 2016-11-09 MED ORDER — ONDANSETRON HCL 4 MG/2ML IJ SOLN
INTRAMUSCULAR | Status: AC
  Filled 2016-11-09: qty 2

## 2016-11-09 MED ORDER — ONDANSETRON HCL 4 MG/2ML IJ SOLN
4.0000 mg | Freq: Once | INTRAMUSCULAR | Status: AC
  Administered 2016-11-09: 4 mg via INTRAVENOUS

## 2016-11-09 MED ORDER — ONDANSETRON 4 MG PO TBDP: 4.0000 mg | ORAL_TABLET | Freq: Three times a day (TID) | ORAL | 0 refills | Status: DC | PRN

## 2016-11-09 NOTE — ED Provider Notes (Signed)
[facility] Emergency Department Provider Note _   First MD Initiated Contact with Patient 11/09/16 0103     (approximate)  I have reviewed the triage vital signs and the nursing notes.   HISTORY  Chief Complaint Emesis and Diarrhea   HPI Paige Alexander is a 21 y.o. female with bolus of chronic medical conditions presents to the emergency department with acute onset of nausea, nonbloody vomiting and diarrhea 1 hour before presentation to the emergency department. Patient states her significant other at bedside had similar symptoms however his symptoms resolved. She states last diarrheal episode was before presentation to the ED. Patient admits to continued nausea however no vomiting   Past Medical History:  Diagnosis Date  . HSV (herpes simplex virus) infection     Patient Active Problem List   Diagnosis Date Noted  . Depression, major, single episode, moderate (HCC) 03/15/2015  . PTSD (post-traumatic stress disorder) 03/15/2015  . Insomnia 03/15/2015    History reviewed. No pertinent surgical history.  Prior to Admission medications   Medication Sig Start Date End Date Taking? Authorizing Provider  acyclovir (ZOVIRAX) 400 MG tablet Take 400 mg by mouth 2 (two) times daily.    [provider]  azithromycin (ZITHROMAX) 250 MG tablet Take 1 tablet (250 mg total) by mouth daily. Patient not taking: Reported on 11/09/2016 10/26/16   Irean HongSung, Jade J, MD  chlorpheniramine-HYDROcodone [facility](TUSSIONEX PENNKINETIC ER) 10-8 MG/5ML SUER Take 5 mLs by mouth every 12 (twelve) hours as needed. Patient not taking: Reported on 11/09/2016 09/15/16   Darci CurrentBrown, Coleman N, MD  drospirenone-ethinyl estradiol [facility](ZARAH) 3-0.03 MG tablet Take 1 tablet by mouth daily.    [provider]  metroNIDAZOLE (FLAGYL) 500 MG tablet Take 1 tablet (500 mg total) by mouth 2 (two) times daily. Patient not taking: Reported on 11/09/2016 07/20/16   Tommi RumpsSummers, Rhonda L, PA-C  ondansetron  (ZOFRAN ODT) 4 MG disintegrating tablet Take 1 tablet (4 mg total) by mouth every 8 (eight) hours as needed for nausea or vomiting. 11/09/16   Darci CurrentBrown, Pierce N, MD    Allergies No known drug allergies  Family history Noncontributory  Social History Social History  Substance Use Topics  . Smoking status: Never Smoker  . Smokeless tobacco: Never Used  . Alcohol use No    Review of Systems Constitutional: No fever/chills Eyes: No visual changes. ENT: No sore throat. Cardiovascular: Denies chest pain. Respiratory: Denies shortness of breath. Gastrointestinal: No abdominal pain.  Positive for nausea and vomiting and diarrhea Genitourinary: Negative for dysuria. Musculoskeletal: Negative for neck pain.  Negative for back pain. Integumentary: Negative for rash. Neurological: Negative for headaches, focal weakness or numbness.  ____________________________________________   PHYSICAL EXAM:  VITAL SIGNS: ED Triage Vitals [11/08/16 2257]  Enc Vitals Group     BP 135/75     Pulse Rate 88     Resp 16     Temp 98.5 F (36.9 C)     Temp Source Oral     SpO2 100 %     Weight 68 kg (150 lb)     Height 1.524 m (5')     Head Circumference      Peak Flow      Pain Score      Pain Loc      Pain Edu?      Excl. in GC?     Constitutional: Alert and oriented. Well appearing and in no acute distress. Eyes: Conjunctivae are normal.  Head: Atraumatic. Mouth/Throat: Mucous  membranes are moist.  Oropharynx non-erythematous. Neck: No stridor.  Cardiovascular: Normal rate, regular rhythm. Good peripheral circulation. Grossly normal heart sounds. Respiratory: Normal respiratory effort.  No retractions. Lungs CTAB. Gastrointestinal: Soft and nontender. No distention.  Musculoskeletal: No lower extremity tenderness nor edema. No gross deformities of extremities. Neurologic:  Normal speech and language. No gross focal neurologic deficits are appreciated.  Skin:  Skin is warm, dry and  intact. No rash noted. Psychiatric: Mood and affect are normal. Speech and behavior are normal.  ____________________________________________   LABS (all labs ordered are listed, but only abnormal results are displayed)  Labs Reviewed  URINALYSIS, COMPLETE (UACMP) WITH MICROSCOPIC - Abnormal; Notable for the following:       Result Value   Color, Urine YELLOW (*)    APPearance HAZY (*)    Leukocytes, UA TRACE (*)    Squamous Epithelial / LPF 6-30 (*)    All other components within normal limits  LIPASE, BLOOD  COMPREHENSIVE METABOLIC PANEL  CBC  POC URINE PREG, ED  POCT PREGNANCY, URINE     Procedures   ____________________________________________   INITIAL IMPRESSION / ASSESSMENT AND PLAN / ED COURSE  Pertinent labs & imaging results that were available during my care of the patient were reviewed by me and considered in my medical decision making (see chart for details).  Patient received IV Zofran 4 mg, IV normal saline 1 L. No further vomiting or diarrhea while in the emergency department. Patient will be discharged home      ____________________________________________  FINAL CLINICAL IMPRESSION(S) / ED DIAGNOSES  Final diagnoses:  Gastroenteritis     MEDICATIONS GIVEN DURING THIS VISIT:  Medications  sodium chloride 0.9 % bolus 1,000 mL (0 mLs Intravenous Stopped 11/09/16 0214)  ondansetron (ZOFRAN) injection 4 mg (4 mg Intravenous Given 11/09/16 0146)     NEW OUTPATIENT MEDICATIONS STARTED DURING THIS VISIT:  Discharge Medication List as of 11/09/2016  2:40 AM    START taking these medications   Details  ondansetron (ZOFRAN ODT) 4 MG disintegrating tablet Take 1 tablet (4 mg total) by mouth every 8 (eight) hours as needed for nausea or vomiting., Starting Thu 11/09/2016, Print        Discharge Medication List as of 11/09/2016  2:40 AM      Discharge Medication List as of 11/09/2016  2:40 AM       Note:  This document was prepared using  Dragon voice recognition software and may include unintentional dictation errors.    Darci Current, MD 11/09/16 [phone]

## 2016-11-09 NOTE — ED Notes (Signed)
Pt reports IV site has become irritated and is requesting that bolus be stopped. IV site is patent and intact. IV flushed and draws blood without difficulty. Fluids stopped per pt request.

## 2016-11-16 ENCOUNTER — Encounter: Payer: Self-pay | Admitting: Emergency Medicine

## 2016-11-16 ENCOUNTER — Emergency Department
Admission: EM | Admit: 2016-11-16 | Discharge: 2016-11-16 | Disposition: A | Discharge status: Home / Self Care | Payer: Medicaid - Out of State | Attending: Emergency Medicine | Admitting: Emergency Medicine

## 2016-11-16 DIAGNOSIS — H9201 Otalgia, right ear: Secondary | ICD-10-CM | POA: Diagnosis present

## 2016-11-16 DIAGNOSIS — J069 Acute upper respiratory infection, unspecified: Secondary | ICD-10-CM

## 2016-11-16 DIAGNOSIS — Z79899 Other long term (current) drug therapy: Secondary | ICD-10-CM | POA: Diagnosis not present

## 2016-11-16 DIAGNOSIS — H7291 Unspecified perforation of tympanic membrane, right ear: Secondary | ICD-10-CM | POA: Diagnosis not present

## 2016-11-16 DIAGNOSIS — B009 Herpesviral infection, unspecified: Secondary | ICD-10-CM | POA: Insufficient documentation

## 2016-11-16 DIAGNOSIS — J399 Disease of upper respiratory tract, unspecified: Secondary | ICD-10-CM | POA: Diagnosis not present

## 2016-11-16 DIAGNOSIS — F329 Major depressive disorder, single episode, unspecified: Secondary | ICD-10-CM | POA: Insufficient documentation

## 2016-11-16 MED ORDER — CIPROFLOXACIN-DEXAMETHASONE 0.3-0.1 % OT SUSP
4.0000 [drp] | Freq: Two times a day (BID) | OTIC | Status: DC
  Administered 2016-11-16: 4 [drp] via OTIC
  Filled 2016-11-16: qty 7.5

## 2016-11-16 MED ORDER — AMOXICILLIN 500 MG PO TABS: 500.0000 mg | ORAL_TABLET | Freq: Three times a day (TID) | ORAL | 0 refills | Status: DC

## 2016-11-16 MED ORDER — TRAMADOL HCL 50 MG PO TABS: 50.0000 mg | ORAL_TABLET | Freq: Four times a day (QID) | ORAL | 0 refills | Status: DC | PRN

## 2016-11-16 NOTE — ED Provider Notes (Signed)
[facility] Emergency Department Provider Note ____________________________________________  Time seen: Approximately 10:02 PM  I have reviewed the triage vital signs and the nursing notes.   HISTORY  Chief Complaint Otalgia    HPI Georgina Pillionakayla Hunt is a 21 y.o. female who presents to the emergency department for evaluation of right ear pain. She states that she's had a URI for the past 4 or 5 days, but ear pain started yesterday.She states that the pain increased to the point where she had to leave work this evening. She states that she put some water yesterday trying to "rinse it out" and attempted the same thing this morning, without any relief. She states that she has some decreased hearing in the right ear as well.  Past Medical History:  Diagnosis Date  . HSV (herpes simplex virus) infection     Patient Active Problem List   Diagnosis Date Noted  . Depression, major, single episode, moderate (HCC) 03/15/2015  . PTSD (post-traumatic stress disorder) 03/15/2015  . Insomnia 03/15/2015    History reviewed. No pertinent surgical history.  Prior to Admission medications   Medication Sig Start Date End Date Taking? Authorizing Provider  acyclovir (ZOVIRAX) 400 MG tablet Take 400 mg by mouth 2 (two) times daily.    [provider]  amoxicillin (AMOXIL) 500 MG tablet Take 1 tablet (500 mg total) by mouth 3 (three) times daily. 11/16/16   Billye Nydam B, FNP  azithromycin (ZITHROMAX) 250 MG tablet Take 1 tablet (250 mg total) by mouth daily. Patient not taking: Reported on 11/09/2016 10/26/16   Irean HongSung, Jade J, MD  chlorpheniramine-HYDROcodone [facility](TUSSIONEX PENNKINETIC ER) 10-8 MG/5ML SUER Take 5 mLs by mouth every 12 (twelve) hours as needed. Patient not taking: Reported on 11/09/2016 09/15/16   Darci CurrentBrown, [place] N, MD  drospirenone-ethinyl estradiol [facility](ZARAH) 3-0.03 MG tablet Take 1 tablet by mouth daily.    [provider]  metroNIDAZOLE (FLAGYL) 500  MG tablet Take 1 tablet (500 mg total) by mouth 2 (two) times daily. Patient not taking: Reported on 11/09/2016 07/20/16   Bridget HartshornSummers, Rhonda L, PA-C  ondansetron (ZOFRAN ODT) 4 MG disintegrating tablet Take 1 tablet (4 mg total) by mouth every 8 (eight) hours as needed for nausea or vomiting. 11/09/16   Darci CurrentBrown, [place] N, MD  traMADol (ULTRAM) 50 MG tablet Take 1 tablet (50 mg total) by mouth every 6 (six) hours as needed. 11/16/16   Chinita Pesterriplett, Carla Rashad B, FNP    Allergies Patient has no known allergies.  No family history on file.  Social History Social History  Substance Use Topics  . Smoking status: Never Smoker  . Smokeless tobacco: Never Used  . Alcohol use No    Review of Systems Constitutional: Well appearing. Eyes: Negative for conjunctival erythema or drainage ENT: Positive for nasal congestion and otalgia of the right ear  Gastrointestinal: Negative for nausea or vomiting. Negative for diarrhea. Musculoskeletal: Negative for myalgias Skin: Negative for rash Neurological: Negative for paresthesias ____________________________________________   PHYSICAL EXAM:  VITAL SIGNS: ED Triage Vitals [11/16/16 2128]  Enc Vitals Group     BP 118/75     Pulse Rate 74     Resp 18     Temp 98 F (36.7 C)     Temp Source Oral     SpO2 100 %     Weight 150 lb (68 kg)     Height 5' (1.524 m)     Head Circumference      Peak Flow  Pain Score 10     Pain Loc      Pain Edu?      Excl. in GC?     Constitutional: Well appearing. Eyes: Conjunctivae are clear without discharge or drainage. Ears: Right tympanic membrane with a large area of perforation. Left tympanic membrane is intact with positive light reflex Head: Atraumatic Nose: Nasal congestion noted. No tenderness over the frontal or maxillary sinus. Mouth/Throat: Oropharynx is mildly erythematous. Tonsils not visualized. Hematological/Lymphatic/Immunilogical: Anterior cervical lymph nodes are not palpable on  exam Cardiovascular: Heart rate regular. Capillary refills less than 3 seconds. Respiratory: Breath sounds clear to auscultation throughout.  Neurologic:  Alert and oriented 4.  Skin: No rash, lesion, or wound on exposed skin surfaces. ____________________________________________   LABS (all labs ordered are listed, but only abnormal results are displayed)  Labs Reviewed - No data to display ____________________________________________   RADIOLOGY  Not indicated ____________________________________________   PROCEDURES  Procedure(s) performed: None  ____________________________________________   INITIAL IMPRESSION / ASSESSMENT AND PLAN / ED COURSE  21 year old female presenting to the emergency department for evaluation of right ear pain that started last night. On exam, right tympanic membrane is ruptured. She'll be treated with amoxicillin and given Ciprodex drops tonight. She'll be encouraged to call and schedule follow-up appointment with the otolaryngologist on call. She was instructed to return to the emergency department for symptoms that change or worsen if she is unable schedule an appointment with her primary care provider, or the specialist on-call.  Pertinent labs & imaging results that were available during my care of the patient were reviewed by me and considered in my medical decision making (see chart for details). ____________________________________________   FINAL CLINICAL IMPRESSION(S) / ED DIAGNOSES  Final diagnoses:  Ruptured tympanic membrane, right  Upper respiratory tract infection, unspecified type    New Prescriptions   AMOXICILLIN (AMOXIL) 500 MG TABLET    Take 1 tablet (500 mg total) by mouth 3 (three) times daily.   TRAMADOL (ULTRAM) 50 MG TABLET    Take 1 tablet (50 mg total) by mouth every 6 (six) hours as needed.    If controlled substance prescribed during this visit, 12 month history viewed on the [facility] prior to issuing an initial  prescription for Schedule II or III opiod.   Note:  This document was prepared using Dragon voice recognition software and may include unintentional dictation errors.     Chinita Pester, FNP 11/16/16 2207    Nita Sickle, MD 11/16/16 2322

## 2016-11-16 NOTE — ED Triage Notes (Signed)
Patient ambulatory to triage with steady gait, without difficulty or distress noted; pt reports right ear pain, recent cold symptoms

## 2016-11-16 NOTE — Discharge Instructions (Signed)
Please schedule a follow up with the ENT specialist. Return to the ER for symptoms that change or worsen if unable to see your primary care doctor or the specialist.

## 2016-12-01 ENCOUNTER — Encounter: Payer: Self-pay | Admitting: Obstetrics and Gynecology

## 2016-12-06 ENCOUNTER — Ambulatory Visit (INDEPENDENT_AMBULATORY_CARE_PROVIDER_SITE_OTHER): Payer: Self-pay | Admitting: Obstetrics and Gynecology

## 2016-12-06 ENCOUNTER — Encounter: Payer: Self-pay | Admitting: Obstetrics and Gynecology

## 2016-12-06 ENCOUNTER — Other Ambulatory Visit: Payer: Self-pay | Admitting: Obstetrics and Gynecology

## 2016-12-06 VITALS — BP 103/67 | HR 70 | Ht 60.0 in | Wt 186.4 lb

## 2016-12-06 DIAGNOSIS — N926 Irregular menstruation, unspecified: Secondary | ICD-10-CM

## 2016-12-06 DIAGNOSIS — Z113 Encounter for screening for infections with a predominantly sexual mode of transmission: Secondary | ICD-10-CM

## 2016-12-06 LAB — POCT URINE PREGNANCY: PREG TEST UR: NEGATIVE

## 2016-12-06 NOTE — Progress Notes (Signed)
Subjective:     Patient ID: Paige Alexander, female   DOB: 02/17/1996, 21 y.o.   MRN: [id number]  HPI Desires pregnancy, already has 732 year old daughter. Stopped depo 1 year ago. Menses were monthly lasting 4-5 days. Stopped OCPs in feb/march.  Currently nine days late. No symptoms of pregnancy. With negative home UPT. Was treated for trich in ED back in February and denies follow up for TOC. [place]. Denies any other stressors.   Review of Systems Negative except stated above in HPI    Ob``````````````````````````````````````jective:   Physical Exam A&Ox4 Well groomed female in no distress Blood pressure 103/67, pulse 70, height 5' (1.524 m), weight 186 lb 6.4 oz (84.6 kg), last menstrual period 10/24/2016. Body mass index is 36.4 kg/m.  UPT- Thyroid normal on exam Abdomen soft and not tender Pelvic exam: normal external genitalia, vulva, vagina, cervix, uterus and adnexa, aptima obtained-sent for TOC.    Assessment:     Missed menses Obesity TOC for trich    Plan:     Counseled on findings and labs needed. Will follow up accordingly. Counseled on normal menses timing and stressors affecting that. Also informed of normal to take 6-12 months to achieve pregnancy. Desires recheck in 6 months if not pregnant by then. Encouraged healthy lifestyle till then.  >50% of 15 minute visit spent in counseling on above.  Rushie Brazel [place], CNM

## 2016-12-06 NOTE — Patient Instructions (Signed)
Thank you for enrolling in MyChart. Please follow the instructions below to securely access your online medical record. MyChart allows you to send messages to your doctor, view your test results, renew your prescriptions, schedule appointments, and more.  How Do I Sign Up? 1. In your Internet browser, go to [email or web address]. 2. Click on the New  User? link in the Sign In box.  3. Enter your MyChart Access Code exactly as it appears below. You will not need to use this code after you have completed the sign-up process. If you do not sign up before the expiration date, you must request a new code. MyChart Access Code: [email or web address] Expires: 12/25/2016  2:27 AM  4. Enter the last four digits of your Social Security Number (xxxx) and Date of Birth (mm/dd/yyyy) as indicated and click Next. You will be taken to the next sign-up page. 5. Create a MyChart ID. This will be your MyChart login ID and cannot be changed, so think of one that is secure and easy to remember. 6. Create a MyChart password. You can change your password at any time. 7. Enter your Password Reset Question and Answer and click Next. This can be used at a later time if you forget your password.  8. Select your communication preference, and if applicable enter your e-mail address. You will receive e-mail notification when new information is available in MyChart by choosing to receive e-mail notifications and filling in your e-mail. 9. Click Sign In. You can now view your medical record.   Additional Information If you have questions, you can email [email or web address] or call [phone] to talk to our MyChart staff. Remember, MyChart is NOT to be used for urgent needs. For medical emergencies, dial 911.

## 2016-12-07 LAB — BETA HCG QUANT (REF LAB)

## 2016-12-12 ENCOUNTER — Encounter: Payer: Self-pay | Admitting: Obstetrics and Gynecology

## 2016-12-19 ENCOUNTER — Other Ambulatory Visit: Payer: Self-pay | Admitting: Obstetrics and Gynecology

## 2016-12-19 MED ORDER — MEDROXYPROGESTERONE ACETATE 10 MG PO TABS: 10.0000 mg | ORAL_TABLET | Freq: Every day | ORAL | 2 refills | Status: DC

## 2017-01-01 ENCOUNTER — Encounter: Payer: Self-pay | Admitting: Obstetrics and Gynecology

## 2017-01-17 ENCOUNTER — Emergency Department
Admission: EM | Admit: 2017-01-17 | Discharge: 2017-01-17 | Disposition: A | Discharge status: Home / Self Care | Payer: Medicaid Other | Attending: Student in an Organized Health Care Education/Training Program | Admitting: Student in an Organized Health Care Education/Training Program

## 2017-01-17 ENCOUNTER — Encounter: Payer: Self-pay | Admitting: Obstetrics and Gynecology

## 2017-01-17 DIAGNOSIS — J029 Acute pharyngitis, unspecified: Secondary | ICD-10-CM | POA: Diagnosis not present

## 2017-01-17 DIAGNOSIS — J069 Acute upper respiratory infection, unspecified: Secondary | ICD-10-CM

## 2017-01-17 DIAGNOSIS — R51 Headache: Secondary | ICD-10-CM | POA: Insufficient documentation

## 2017-01-17 DIAGNOSIS — R0981 Nasal congestion: Secondary | ICD-10-CM | POA: Diagnosis present

## 2017-01-17 LAB — POCT RAPID STREP A: Streptococcus, Group A Screen (Direct): NEGATIVE

## 2017-01-17 MED ORDER — FLUTICASONE PROPIONATE 50 MCG/ACT NA SUSP: 1.0000 | Freq: Every day | NASAL | 0 refills | Status: DC

## 2017-01-17 MED ORDER — DEXAMETHASONE 4 MG PO TABS
10.0000 mg | ORAL_TABLET | Freq: Once | ORAL | Status: AC
  Administered 2017-01-17: 10 mg via ORAL
  Filled 2017-01-17: qty 2.5

## 2017-01-17 NOTE — ED Triage Notes (Signed)
Pt in with co sore throat, sinus pressure, and sinus headache since Saturday. Hx of the same and was told it was URI.

## 2017-01-17 NOTE — ED Provider Notes (Addendum)
[facility] Emergency Department Provider Note    First MD Initiated Contact with Patient 01/17/17 0255     (approximate)  I have reviewed the triage vital signs and the nursing notes.   HISTORY  Chief Complaint Facial Pain    HPI Paige Alexander is a 21 y.o. female presents with chief complaint of nasal congestion and sore throat over the past several days. Patient had to leave work over the weekend due to feeling unwell. States that she was also having headaches since then. Has had similar headaches in the past related to upper respiratory infections. Has not had any fevers or chills. No nausea or vomiting. No chest pain or cough. Has not tried anything at home. Presented to the ER tonight because she do not feel she can go to work this morning at [employer].Denies any other complaints. No dysuria. No diarrhea.   Past Medical History:  Diagnosis Date  . HSV (herpes simplex virus) infection    No family history on file. No past surgical history on file. Patient Active Problem List   Diagnosis Date Noted  . Depression, major, single episode, moderate (HCC) 03/15/2015  . PTSD (post-traumatic stress disorder) 03/15/2015  . Insomnia 03/15/2015      Prior to Admission medications   Medication Sig Start Date End Date Taking? Authorizing Provider  acyclovir (ZOVIRAX) 400 MG tablet Take 400 mg by mouth 2 (two) times daily.    [provider]  medroxyPROGESTERone (PROVERA) 10 MG tablet Take 1 tablet (10 mg total) by mouth daily. Use for ten days 12/19/16   Shambley, Melody N, CNM  metroNIDAZOLE (FLAGYL) 500 MG tablet Take 1 tablet (500 mg total) by mouth 2 (two) times daily. Patient not taking: Reported on 11/09/2016 07/20/16   Bridget HartshornSummers, Rhonda L, PA-C  ondansetron (ZOFRAN ODT) 4 MG disintegrating tablet Take 1 tablet (4 mg total) by mouth every 8 (eight) hours as needed for nausea or vomiting. Patient not taking: Reported on 12/06/2016 11/09/16    Darci CurrentBrown, [place] N, MD    Allergies Patient has no known allergies.    Social History Social History  Substance Use Topics  . Smoking status: Never Smoker  . Smokeless tobacco: Never Used  . Alcohol use No    Review of Systems Patient denies headaches, rhinorrhea, blurry vision, numbness, shortness of breath, chest pain, edema, cough, abdominal pain, nausea, vomiting, diarrhea, dysuria, fevers, rashes or hallucinations unless otherwise stated above in HPI. ____________________________________________   PHYSICAL EXAM:  VITAL SIGNS: Vitals:   01/17/17 0206  BP: 128/66  Pulse: 89  Resp: 18  Temp: 98.2 F (36.8 C)    Constitutional: Alert and oriented. Well appearing and in no acute distress. Eyes: Conjunctivae are normal.  Head: Atraumatic. Nose: non purulent congestion/rhinnorhea. Mouth/Throat: Mucous membranes are moist.  Erythema of bilateral tonsils without exudates, uvula midline.  + bilateral cervical lymphadenopathy Neck: Painless ROM.  Cardiovascular:   Good peripheral circulation. Respiratory: Normal respiratory effort.  No retractions.  Gastrointestinal: Soft and nontender.  Musculoskeletal: No lower extremity tenderness .  No joint effusions. Neurologic:  Normal speech and language. No gross focal neurologic deficits are appreciated.  Skin:  Skin is warm, dry and intact. No rash noted. Psychiatric: Mood and affect are normal. Speech and behavior are normal.  ____________________________________________   LABS (all labs ordered are listed, but only abnormal results are displayed)  Results for orders placed or performed during the hospital encounter of 01/17/17 (from the past 24 hour(s))  POCT rapid strep  A [facility])     Status: None   Collection Time: 01/17/17  2:14 AM  Result Value Ref Range   Streptococcus, Group A Screen (Direct) NEGATIVE NEGATIVE    ____________________________________________ ____________________________________________   PROCEDURES  Procedure(s) performed:  Procedures    Critical Care performed: no ____________________________________________   INITIAL IMPRESSION / ASSESSMENT AND PLAN / ED COURSE  Pertinent labs & imaging results that were available during my care of the patient were reviewed by me and considered in my medical decision making (see chart for details).  DDX: uri, strep, sinusitis, pharyngitis, allergies  Paige Alexander is a 21 y.o. who presents to the ED with upper respiratory infection. Likely viral. Strep is negative. Patient well-appearing and in no acute distress. Abdominal exam soft and benign.  No evidence of PTA or RPA.  Patient was able to tolerate PO and was able to ambulate with a steady gait.  Will give decadron and rx for nasal spray.   Return precautions discussed.  Have discussed with the patient and available family all diagnostics and treatments performed thus far and all questions were answered to the best of my ability. The patient demonstrates understanding and agreement with plan.        ____________________________________________   FINAL CLINICAL IMPRESSION(S) / ED DIAGNOSES  Final diagnoses:  Upper respiratory tract infection, unspecified type      NEW MEDICATIONS STARTED DURING THIS VISIT:  New Prescriptions   No medications on file     Note:  This document was prepared using Dragon voice recognition software and may include unintentional dictation errors.     Willy Eddy, MD 01/17/17 1610    Willy Eddy, MD 01/17/17 [phone]

## 2017-01-17 NOTE — ED Notes (Signed)
Pt ambulatory without difficulty, steady gait noted.

## 2017-01-19 LAB — CULTURE, GROUP A STREP (THRC)

## 2017-01-24 ENCOUNTER — Encounter: Payer: Self-pay | Admitting: Obstetrics and Gynecology

## 2017-02-05 ENCOUNTER — Encounter: Payer: Self-pay | Admitting: Obstetrics and Gynecology

## 2017-02-07 ENCOUNTER — Telehealth: Payer: Self-pay | Admitting: Obstetrics and Gynecology

## 2017-02-07 ENCOUNTER — Other Ambulatory Visit: Payer: Self-pay | Admitting: Obstetrics and Gynecology

## 2017-02-07 DIAGNOSIS — N926 Irregular menstruation, unspecified: Secondary | ICD-10-CM

## 2017-02-07 NOTE — Telephone Encounter (Signed)
pls advise

## 2017-02-07 NOTE — Telephone Encounter (Signed)
Please let her know orders for lab are in and she can stop by and have them drawn. Will message her when results are in.

## 2017-02-07 NOTE — Telephone Encounter (Signed)
The patient sent a MyChart message and stated she did not get a response, The patient then called and wanted the same message sent back today, since she was not able to speak with Melody directly. "Hello I was wondering if you had any openings for Wednesday? I was hoping to get checked for thyroids and to check my hormone levels ."  The patient did not disclose any other information. Please advise.

## 2017-02-08 ENCOUNTER — Other Ambulatory Visit: Payer: Medicaid Other

## 2017-02-08 DIAGNOSIS — N926 Irregular menstruation, unspecified: Secondary | ICD-10-CM

## 2017-02-08 NOTE — Telephone Encounter (Signed)
Spoke with pt

## 2017-02-09 LAB — THYROID PANEL WITH TSH
FREE THYROXINE INDEX: 1.7 (ref 1.2–4.9)
T3 UPTAKE RATIO: 22 % — AB (ref 24–39)
T4, Total: 7.9 ug/dL (ref 4.5–12.0)
TSH: 1.86 u[IU]/mL (ref 0.450–4.500)

## 2017-02-09 LAB — DHEA-SULFATE: DHEA-SO4: 261.6 ug/dL (ref 110.0–431.7)

## 2017-02-09 LAB — ESTRADIOL: ESTRADIOL: 216.6 pg/mL

## 2017-02-09 LAB — FSH/LH
FSH: 4.1 m[IU]/mL
LH: 17.3 m[IU]/mL

## 2017-02-09 LAB — PROGESTERONE: PROGESTERONE: 0.3 ng/mL

## 2017-02-13 ENCOUNTER — Encounter: Payer: Self-pay | Admitting: Obstetrics and Gynecology

## 2017-02-15 ENCOUNTER — Telehealth: Payer: Self-pay | Admitting: Obstetrics and Gynecology

## 2017-02-15 NOTE — Telephone Encounter (Signed)
Patient lvm stating that she has been calling for 3 days, We have not received any voicemail's from the patient, today is the 1st. The patient stated that she would like to get her results, and have some one explain the results to her. I saw that she sent a MyChart message on 02/13/2017 stating.  " Can you explain the results to me , ? I have no clue and still have no period , I've had some pregnancy symptoms but haven't took a test in a week or two "  The patient did not disclose any other information other than wanting to speak with some one in regards to her results. Please advise.

## 2017-02-15 NOTE — Telephone Encounter (Signed)
Called pt.

## 2017-02-18 ENCOUNTER — Encounter: Payer: Self-pay | Admitting: Obstetrics and Gynecology

## 2017-02-22 ENCOUNTER — Encounter: Payer: Self-pay | Admitting: Emergency Medicine

## 2017-02-22 ENCOUNTER — Encounter: Payer: Self-pay | Admitting: Obstetrics and Gynecology

## 2017-02-22 ENCOUNTER — Emergency Department
Admission: EM | Admit: 2017-02-22 | Discharge: 2017-02-22 | Disposition: A | Discharge status: Home / Self Care | Payer: Medicaid Other | Attending: Emergency Medicine | Admitting: Emergency Medicine

## 2017-02-22 DIAGNOSIS — Z5321 Procedure and treatment not carried out due to patient leaving prior to being seen by health care provider: Secondary | ICD-10-CM | POA: Diagnosis not present

## 2017-02-22 DIAGNOSIS — R51 Headache: Secondary | ICD-10-CM | POA: Diagnosis not present

## 2017-02-22 DIAGNOSIS — H538 Other visual disturbances: Secondary | ICD-10-CM | POA: Insufficient documentation

## 2017-02-22 DIAGNOSIS — Z3201 Encounter for pregnancy test, result positive: Secondary | ICD-10-CM | POA: Diagnosis not present

## 2017-02-22 LAB — POCT PREGNANCY, URINE: PREG TEST UR: POSITIVE — AB

## 2017-02-22 NOTE — ED Notes (Signed)
Urine pregnancy was positive; line very faint

## 2017-02-22 NOTE — ED Notes (Signed)
Dr. Derrill KayGoodman went in patients room to assess patient. Patient and family was not in room. Patient not in restrooms at this time.

## 2017-02-22 NOTE — ED Triage Notes (Signed)
Pt c/o headache that lasted for one month went away for one month and now has been back for two weeks.  C/o blurry vision in both eyes with this headache.  Tried tylenol OTC but no relief.  No nausea.  No photophobia.  Last period was in July, negative home test but pt unsure if pregnant.  Ambulatory to triage without difficulty. Speech clear.

## 2017-02-22 NOTE — ED Provider Notes (Signed)
patient left prior to my assessment.   Phineas SemenGoodman, Paige Lance, MD 02/22/17 [phone]

## 2017-02-23 ENCOUNTER — Emergency Department
Admission: EM | Admit: 2017-02-23 | Discharge: 2017-02-23 | Disposition: A | Discharge status: Home / Self Care | Payer: Medicaid Other | Attending: Emergency Medicine | Admitting: Emergency Medicine

## 2017-02-23 ENCOUNTER — Encounter: Payer: Self-pay | Admitting: Emergency Medicine

## 2017-02-23 ENCOUNTER — Emergency Department: Payer: Medicaid Other

## 2017-02-23 DIAGNOSIS — Z79899 Other long term (current) drug therapy: Secondary | ICD-10-CM | POA: Insufficient documentation

## 2017-02-23 DIAGNOSIS — N644 Mastodynia: Secondary | ICD-10-CM | POA: Insufficient documentation

## 2017-02-23 DIAGNOSIS — R51 Headache: Secondary | ICD-10-CM | POA: Diagnosis not present

## 2017-02-23 DIAGNOSIS — R102 Pelvic and perineal pain: Secondary | ICD-10-CM | POA: Diagnosis present

## 2017-02-23 DIAGNOSIS — Z3491 Encounter for supervision of normal pregnancy, unspecified, first trimester: Secondary | ICD-10-CM

## 2017-02-23 DIAGNOSIS — O26891 Other specified pregnancy related conditions, first trimester: Secondary | ICD-10-CM | POA: Diagnosis not present

## 2017-02-23 LAB — URINALYSIS, COMPLETE (UACMP) WITH MICROSCOPIC
Bacteria, UA: NONE SEEN
Bilirubin Urine: NEGATIVE
Glucose, UA: NEGATIVE mg/dL
Hgb urine dipstick: NEGATIVE
Ketones, ur: NEGATIVE mg/dL
Leukocytes, UA: NEGATIVE
Nitrite: NEGATIVE
Protein, ur: NEGATIVE mg/dL
RBC / HPF: NONE SEEN RBC/hpf (ref 0–5)
Specific Gravity, Urine: 1.002 — ABNORMAL LOW (ref 1.005–1.030)
Squamous Epithelial / LPF: NONE SEEN
WBC, UA: NONE SEEN WBC/hpf (ref 0–5)
pH: 6 (ref 5.0–8.0)

## 2017-02-23 LAB — CBC WITH DIFFERENTIAL/PLATELET
Basophils Absolute: 0.1 10*3/uL (ref 0–0.1)
Basophils Relative: 1 %
EOS PCT: 1 %
Eosinophils Absolute: 0.1 10*3/uL (ref 0–0.7)
HEMATOCRIT: 40.7 % (ref 35.0–47.0)
Hemoglobin: 13.7 g/dL (ref 12.0–16.0)
LYMPHS ABS: 3.9 10*3/uL — AB (ref 1.0–3.6)
LYMPHS PCT: 41 %
MCH: 28.8 pg (ref 26.0–34.0)
MCHC: 33.7 g/dL (ref 32.0–36.0)
MCV: 85.5 fL (ref 80.0–100.0)
MONO ABS: 0.8 10*3/uL (ref 0.2–0.9)
MONOS PCT: 8 %
NEUTROS ABS: 4.7 10*3/uL (ref 1.4–6.5)
Neutrophils Relative %: 49 %
Platelets: 365 10*3/uL (ref 150–440)
RBC: 4.76 MIL/uL (ref 3.80–5.20)
RDW: 13.5 % (ref 11.5–14.5)
WBC: 9.4 10*3/uL (ref 3.6–11.0)

## 2017-02-23 LAB — COMPREHENSIVE METABOLIC PANEL
ALT: 15 U/L (ref 14–54)
AST: 19 U/L (ref 15–41)
Albumin: 3.9 g/dL (ref 3.5–5.0)
Alkaline Phosphatase: 61 U/L (ref 38–126)
Anion gap: 8 (ref 5–15)
BILIRUBIN TOTAL: 0.3 mg/dL (ref 0.3–1.2)
BUN: 12 mg/dL (ref 6–20)
CO2: 24 mmol/L (ref 22–32)
CREATININE: 0.75 mg/dL (ref 0.44–1.00)
Calcium: 9.1 mg/dL (ref 8.9–10.3)
Chloride: 106 mmol/L (ref 101–111)
GFR calc non Af Amer: >60 mL/min (ref >60)
Glucose, Bld: 117 mg/dL — ABNORMAL HIGH (ref 65–99)
POTASSIUM: 4.1 mmol/L (ref 3.5–5.1)
Sodium: 138 mmol/L (ref 135–145)
TOTAL PROTEIN: 7.3 g/dL (ref 6.5–8.1)

## 2017-02-23 LAB — HCG, QUANTITATIVE, PREGNANCY: hCG, Beta Chain, Quant, S: 29 m[IU]/mL — ABNORMAL HIGH (ref <5)

## 2017-02-23 NOTE — ED Notes (Signed)
Pt left without discharge paperwork and without e-signiture

## 2017-02-23 NOTE — ED Provider Notes (Signed)
[facility] Emergency Department Provider Note   First MD Initiated Contact with Patient 02/23/17 [phone]     (approximate)  I have reviewed the triage vital signs and the nursing notes.   HISTORY  Chief Complaint Abdominal Pain   HPI Paige Alexander is a 21 y.o. female G2P1 last menstrual period July returns to the emergency department with pelvic discomfort breast tenderness headache 1 week. Patient states that she took a pregnancy test on Saturday which was negative however patient came to the emergency department on 02/22/2017 and was notified that she had a positive pregnancy test but left before evaluated by the physician. Patient denies any fever no dysuria no increased urinary frequency or urgency. Patient denies any vaginal bleeding   Past Medical History:  Diagnosis Date  . HSV (herpes simplex virus) infection     Patient Active Problem List   Diagnosis Date Noted  . Depression, major, single episode, moderate (HCC) 03/15/2015  . PTSD (post-traumatic stress disorder) 03/15/2015  . Insomnia 03/15/2015    Past surgical history None  Prior to Admission medications   Medication Sig Start Date End Date Taking? Authorizing Provider  acyclovir (ZOVIRAX) 400 MG tablet Take 400 mg by mouth 2 (two) times daily.    [provider]  fluticasone (FLONASE) 50 MCG/ACT nasal spray Place 1 spray into both nostrils daily. 01/17/17 01/17/18  Willy Eddy, MD  medroxyPROGESTERone (PROVERA) 10 MG tablet Take 1 tablet (10 mg total) by mouth daily. Use for ten days 12/19/16   Shambley, Melody N, CNM  metroNIDAZOLE (FLAGYL) 500 MG tablet Take 1 tablet (500 mg total) by mouth 2 (two) times daily. Patient not taking: Reported on 11/09/2016 07/20/16   Bridget Hartshorn L, PA-C  ondansetron (ZOFRAN ODT) 4 MG disintegrating tablet Take 1 tablet (4 mg total) by mouth every 8 (eight) hours as needed for nausea or vomiting. Patient not taking: Reported on 12/06/2016  11/09/16   Darci Current, MD    Allergies No known drug allergies No family history on file.  Social History Social History  Substance Use Topics  . Smoking status: Never Smoker  . Smokeless tobacco: Never Used  . Alcohol use No    Review of Systems Constitutional: No fever/chills Eyes: No visual changes. ENT: No sore throat. Cardiovascular: Denies chest pain. Respiratory: Denies shortness of breath. Gastrointestinal: No abdominal pain.  No nausea, no vomiting.  No diarrhea.  No constipation. Genitourinary: Negative for dysuria. Positive for pelvic discomfort Musculoskeletal: Negative for neck pain.  Negative for back pain. Integumentary: Negative for rash. Neurological: Negative for headaches, focal weakness or numbness.  ____________________________________________   PHYSICAL EXAM:  VITAL SIGNS: ED Triage Vitals  Enc Vitals Group     BP 02/23/17 0147 114/73     Pulse Rate 02/23/17 0147 87     Resp 02/23/17 0147 12     Temp 02/23/17 0147 97.6 F (36.4 C)     Temp Source 02/23/17 0147 Oral     SpO2 02/23/17 0147 97 %     Weight 02/23/17 0145 86.2 kg (190 lb)     Height 02/23/17 0145 1.524 m (5')     Head Circumference --      Peak Flow --      Pain Score --      Pain Loc --      Pain Edu? --      Excl. in GC? --     Constitutional: Alert and oriented. Well appearing and in no acute  distress. Eyes: Conjunctivae are normal. Head: Atraumatic.[name] Mouth/Throat: Mucous membranes are moist.  Oropharynx non-erythematous. Neck: No stridor.   Cardiovascular: Normal rate, regular rhythm. Good peripheral circulation. Grossly normal heart sounds. Respiratory: Normal respiratory effort.  No retractions. Lungs CTAB. Gastrointestinal: Soft and nontender. No distention.  Musculoskeletal: No lower extremity tenderness nor edema. No gross deformities of extremities. Neurologic:  Normal speech and language. No gross focal neurologic deficits are appreciated.  Skin:  Skin is  warm, dry and intact. No rash noted. Psychiatric: Mood and affect are normal. Speech and behavior are normal.  ____________________________________________   LABS (all labs ordered are listed, but only abnormal results are displayed)  Labs Reviewed  HCG, QUANTITATIVE, PREGNANCY - Abnormal; Notable for the following:       Result Value   hCG, Beta Chain, Quant, S 29 (*)    All other components within normal limits  CBC WITH DIFFERENTIAL/PLATELET - Abnormal; Notable for the following:    Lymphs Abs 3.9 (*)    All other components within normal limits  COMPREHENSIVE METABOLIC PANEL - Abnormal; Notable for the following:    Glucose, Bld 117 (*)    All other components within normal limits  URINALYSIS, COMPLETE (UACMP) WITH MICROSCOPIC - Abnormal; Notable for the following:    Color, Urine COLORLESS (*)    APPearance CLEAR (*)    Specific Gravity, Urine 1.002 (*)    All other components within normal limits     RADIOLOGY I, [place] N BROWN, personally viewed and evaluated these images (plain radiographs) as part of my medical decision making, as well as reviewing the written report by the radiologist.  US Ob Comp Less 14 Wks  Result Date: 02/23/2017 CLINICAL DATA:  Pregnant patient in first-trimester pregnancy with pelvic pain. Beta HCG 29. EXAM: OBSTETRIC <14 WK [place] AND TRANSVAGINAL OB US TECHNIQUE: Both transabdominal and transvaginal ultrasound examinations were performed for complete evaluation of the gestation as well as the maternal uterus, adnexal regions, and pelvic cul-de-sac. Transvaginal technique was performed to assess early pregnancy. COMPARISON:  None. FINDINGS: Intrauterine gestational sac: None Yolk sac:  Not Visualized. Embryo:  Not Visualized. Cardiac Activity: Not Visualized. Subchorionic hemorrhage:  Not applicable. Maternal uterus/adnexae: Endometrium is thickened measuring 13 mm. No endometrial fluid. Both ovaries are visualized and are normal with blood flow. No  adnexal mass. No pelvic free fluid. IMPRESSION: No intrauterine pregnancy or findings suspicious for ectopic pregnancy. Findings are consistent with pregnancy of unknown location and may reflect early intrauterine pregnancy not yet visualized sonographically, occult ectopic pregnancy, or failed pregnancy. Recommend trending of beta HCG and follow-up ultrasound in 10-14 days. Electronically Signed   By: Rubye Oaks M.D.   On: 02/23/2017 04:08   US Ob Transvaginal  Result Date: 02/23/2017 CLINICAL DATA:  Pregnant patient in first-trimester pregnancy with pelvic pain. Beta HCG 29. EXAM: OBSTETRIC <14 WK [place] AND TRANSVAGINAL OB US TECHNIQUE: Both transabdominal and transvaginal ultrasound examinations were performed for complete evaluation of the gestation as well as the maternal uterus, adnexal regions, and pelvic cul-de-sac. Transvaginal technique was performed to assess early pregnancy. COMPARISON:  None. FINDINGS: Intrauterine gestational sac: None Yolk sac:  Not Visualized. Embryo:  Not Visualized. Cardiac Activity: Not Visualized. Subchorionic hemorrhage:  Not applicable. Maternal uterus/adnexae: Endometrium is thickened measuring 13 mm. No endometrial fluid. Both ovaries are visualized and are normal with blood flow. No adnexal mass. No pelvic free fluid. IMPRESSION: No intrauterine pregnancy or findings suspicious for ectopic pregnancy. Findings are consistent with pregnancy of unknown location  and may reflect early intrauterine pregnancy not yet visualized sonographically, occult ectopic pregnancy, or failed pregnancy. Recommend trending of beta HCG and follow-up ultrasound in 10-14 days. Electronically Signed   By: Rubye Oaks M.D.   On: 02/23/2017 04:08     Procedures   ____________________________________________   INITIAL IMPRESSION / ASSESSMENT AND PLAN / ED COURSE  Pertinent labs & imaging results that were available during my care of the patient were reviewed by me and considered  in my medical decision making (see chart for details).  21 year old female presenting with above stated history of physical exam. Patient with first trimester pregnancy hCG 29 no visible IUP noted on ultrasound most likely secondary to low Quant. So the patient at length regarding ultrasound findings and inability to visualize pregnancy. I notified of patient at this may be a threatened miscarriage or ectopic pregnancy however is we are unable to tell her at this time secondary to inability to visualize anything on ultrasound. Patient is advised to return to the emergency department immediately if pain worsens or any vaginal bleeding. Patient referred to OB/GYN      ____________________________________________  FINAL CLINICAL IMPRESSION(S) / ED DIAGNOSES  Final diagnoses:  First trimester pregnancy     MEDICATIONS GIVEN DURING THIS VISIT:  Medications - No data to display   NEW OUTPATIENT MEDICATIONS STARTED DURING THIS VISIT:  New Prescriptions   No medications on file    Modified Medications   No medications on file    Discontinued Medications   No medications on file     Note:  This document was prepared using Dragon voice recognition software and may include unintentional dictation errors.    Darci Current, MD 02/23/17 0500

## 2017-02-23 NOTE — ED Triage Notes (Signed)
Patient ambulatory to triage with steady gait, without difficulty or distress noted; pt reports here for HA, lower abd pain, and diarrhea today

## 2017-02-23 NOTE — ED Notes (Signed)
Pt returned from ultrasound

## 2017-02-23 NOTE — ED Notes (Signed)
ED Provider at bedside. 

## 2017-02-27 ENCOUNTER — Ambulatory Visit (INDEPENDENT_AMBULATORY_CARE_PROVIDER_SITE_OTHER): Payer: Self-pay | Admitting: Certified Nurse Midwife

## 2017-02-27 ENCOUNTER — Encounter: Payer: Self-pay | Admitting: Certified Nurse Midwife

## 2017-02-27 VITALS — BP 119/78 | HR 88 | Ht 60.0 in | Wt 197.2 lb

## 2017-02-27 DIAGNOSIS — N912 Amenorrhea, unspecified: Secondary | ICD-10-CM

## 2017-02-27 NOTE — Progress Notes (Signed)
Subjective:    Paige Alexander is a 21 y.o. female who presents for evaluation of amenorrhea. She believes she could be pregnant. Pregnancy is desired. Sexual Activity: single partner, contraception: none. . Last period was normal. She was seen in June by Melody because she did not have a peroid. She was given provera, then had a period in July. Previously had normal cycles every 28 days. She was seen in the ED on 9/14 had a positive urine test and beta hCG of 29 mIU/mL.   No LMP recorded. The following portions of the patient's history were reviewed and updated as appropriate: allergies, current medications, past family history, past medical history, past social history, past surgical history and problem list.  Review of Systems Pertinent items are noted in HPI.     Objective:    There were no vitals taken for this visit. General: alert, cooperative, appears stated age and no acute distress    Lab Review Urine HCG: not done     Assessment:    Absence of menstruation.     Plan:  Beta hCG today, will follow up with results. If elevated will continue to monitor then schedule ultrasound to confirm viable pregnancy. If not pregnant will discuss using provera again.   Doreene Burke, CNN

## 2017-02-28 ENCOUNTER — Other Ambulatory Visit: Payer: Self-pay | Admitting: Certified Nurse Midwife

## 2017-02-28 ENCOUNTER — Encounter: Payer: Self-pay | Admitting: Certified Nurse Midwife

## 2017-02-28 DIAGNOSIS — N912 Amenorrhea, unspecified: Secondary | ICD-10-CM

## 2017-02-28 LAB — BETA HCG QUANT (REF LAB): hCG Quant: 154 m[IU]/mL

## 2017-02-28 NOTE — Progress Notes (Signed)
Repeat beta hCG in one week to confirm viable pregnancy.  Doreene Burke, CNM

## 2017-03-01 ENCOUNTER — Encounter: Payer: Self-pay | Admitting: Emergency Medicine

## 2017-03-01 ENCOUNTER — Emergency Department
Admission: EM | Admit: 2017-03-01 | Discharge: 2017-03-01 | Discharge status: Against Medical Advice | Payer: Medicaid Other | Attending: Emergency Medicine | Admitting: Emergency Medicine

## 2017-03-01 DIAGNOSIS — R519 Headache, unspecified: Secondary | ICD-10-CM

## 2017-03-01 DIAGNOSIS — R51 Headache: Secondary | ICD-10-CM | POA: Insufficient documentation

## 2017-03-01 MED ORDER — ONDANSETRON 8 MG PO TBDP: 8.0000 mg | ORAL_TABLET | Freq: Once | ORAL | Status: DC

## 2017-03-01 MED ORDER — ACETAMINOPHEN 325 MG PO TABS: 650.0000 mg | ORAL_TABLET | Freq: Once | ORAL | Status: DC

## 2017-03-01 NOTE — ED Notes (Signed)
See triage note  States she developed headache about 7-8 weeks ago.positive nausea but states she is preg so thinks that is why she is nauseated low grade fever on arrival   States she tried tylenol w/o relief

## 2017-03-01 NOTE — Discharge Instructions (Signed)
Advised to discuss severity and frequency of headache with her OB doctor. Continue taking extra strength Tylenol .

## 2017-03-01 NOTE — ED Provider Notes (Signed)
[facility] Emergency Department Provider Note   ____________________________________________   First MD Initiated Contact with Patient 03/01/17 [phone]     (approximate)  I have reviewed the triage vital signs and the nursing notes.   HISTORY  Chief Complaint Headache    HPI Paige Alexander is a 21 y.o. female patient complain headache for 2 months. Patient was diagnosed early pregnancy 6 days ago. Patient has seen her OB doctor but did not mention her headaches. She stated headache is relieved with Excedrin. Patient stated she was advised not to take Excedrin secondary to early gestation. She is requesting some stronger for headache. Patient denies history of migraines or family history of this complaint. Patient not taking any medicine for headache this morning. Patient rates the pain as a 9/10. Patient describes the pain as "achy". Patient denies patient disturbance, vertigo, paresthesias or weakness.   Past Medical History:  Diagnosis Date  . HSV (herpes simplex virus) infection     Patient Active Problem List   Diagnosis Date Noted  . Depression, major, single episode, moderate (HCC) 03/15/2015  . PTSD (post-traumatic stress disorder) 03/15/2015  . Insomnia 03/15/2015    History reviewed. No pertinent surgical history.  Prior to Admission medications   Not on File    Allergies Patient has no known allergies.  No family history on file.  Social History Social History  Substance Use Topics  . Smoking status: Never Smoker  . Smokeless tobacco: Never Used  . Alcohol use No    Review of Systems  Constitutional: No fever/chills Eyes: No visual changes. ENT: No sore throat. Cardiovascular: Denies chest pain. Respiratory: Denies shortness of breath. Gastrointestinal: No abdominal pain.  No nausea, no vomiting.  No diarrhea.  No constipation. Genitourinary: Negative for dysuria. Musculoskeletal: Negative for back pain. Skin: Negative  for rash. Neurological: Negative for headaches, focal weakness or numbness. Psychiatric:Depression and PTSD. ____________________________________________   PHYSICAL EXAM:  VITAL SIGNS: ED Triage Vitals  Enc Vitals Group     BP 03/01/17 0821 120/72     Pulse Rate 03/01/17 0821 88     Resp 03/01/17 0821 20     Temp 03/01/17 0821 99.1 F (37.3 C)     Temp Source 03/01/17 0821 Oral     SpO2 03/01/17 0821 100 %     Weight 03/01/17 0822 197 lb (89.4 kg)     Height 03/01/17 0822 5' (1.524 m)     Head Circumference --      Peak Flow --      Pain Score 03/01/17 0829 9     Pain Loc --      Pain Edu? --      Excl. in GC? --     Constitutional: Alert and oriented. Well appearing and in no acute distress. Eyes: Conjunctivae are normal. PERRL. EOMI. Head: Atraumatic. Nose: No congestion/rhinnorhea. Neck: No stridor.  No cervical spine tenderness to palpation. Hematological/Lymphatic/Immunilogical: No cervical lymphadenopathy. Cardiovascular: Normal rate, regular rhythm. Grossly normal heart sounds.  Good peripheral circulation. Respiratory: Normal respiratory effort.  No retractions. Lungs CTAB. Gastrointestinal: Soft and nontender. No distention. No abdominal bruits. No CVA tenderness. Genitourinary: Deferred Musculoskeletal: No lower extremity tenderness nor edema.  No joint effusions. Neurologic:  Normal speech and language. No gross focal neurologic deficits are appreciated. No gait instability. Skin:  Skin is warm, dry and intact. No rash noted. Psychiatric: Mood and affect are normal. Speech and behavior are normal.  ____________________________________________   LABS (all labs ordered are listed,  but only abnormal results are displayed)  Labs Reviewed - No data to display ____________________________________________  EKG   ____________________________________________  RADIOLOGY  No results  found.  ____________________________________________   PROCEDURES  Procedure(s) performed: None  Procedures  Critical Care performed: No  ____________________________________________   INITIAL IMPRESSION / ASSESSMENT AND PLAN / ED COURSE  Pertinent labs & imaging results that were available during my care of the patient were reviewed by me and considered in my medical decision making (see chart for details).  Patient was sent for 2 months of intermittent headache. Discussed with patient rationale for not imaging. Advised patient and over-the-counter extra strength Tylenol until reevaluation by OB doctor.      ____________________________________________   FINAL CLINICAL IMPRESSION(S) / ED DIAGNOSES  Final diagnoses:  Headache disorder      NEW MEDICATIONS STARTED DURING THIS VISIT:  There are no discharge medications for this patient.    Note:  This document was prepared using Dragon voice recognition software and may include unintentional dictation errors.    Joni Reining, PA-C 03/01/17 0915    Jene Every, MD 03/01/17 [phone]

## 2017-03-01 NOTE — ED Triage Notes (Signed)
Pt with headache for two mths. Recently found she is pregnant here on the 14th of Sept.

## 2017-03-01 NOTE — ED Notes (Signed)
Pt not in room for discharge instructions or meds

## 2017-03-05 ENCOUNTER — Emergency Department: Payer: Medicaid Other

## 2017-03-05 ENCOUNTER — Ambulatory Visit (INDEPENDENT_AMBULATORY_CARE_PROVIDER_SITE_OTHER): Payer: Self-pay | Admitting: Certified Nurse Midwife

## 2017-03-05 ENCOUNTER — Emergency Department
Admission: EM | Admit: 2017-03-05 | Discharge: 2017-03-05 | Disposition: A | Discharge status: Home / Self Care | Payer: Medicaid Other | Attending: Emergency Medicine | Admitting: Emergency Medicine

## 2017-03-05 ENCOUNTER — Encounter: Payer: Self-pay | Admitting: Obstetrics and Gynecology

## 2017-03-05 ENCOUNTER — Encounter: Payer: Self-pay | Admitting: Emergency Medicine

## 2017-03-05 VITALS — BP 120/82 | HR 80 | Ht 60.0 in | Wt 200.7 lb

## 2017-03-05 DIAGNOSIS — R109 Unspecified abdominal pain: Secondary | ICD-10-CM

## 2017-03-05 DIAGNOSIS — R519 Headache, unspecified: Secondary | ICD-10-CM

## 2017-03-05 DIAGNOSIS — R51 Headache: Secondary | ICD-10-CM

## 2017-03-05 DIAGNOSIS — N390 Urinary tract infection, site not specified: Secondary | ICD-10-CM | POA: Insufficient documentation

## 2017-03-05 LAB — COMPREHENSIVE METABOLIC PANEL
ALBUMIN: 3.9 g/dL (ref 3.5–5.0)
ALK PHOS: 64 U/L (ref 38–126)
ALT: 11 U/L — ABNORMAL LOW (ref 14–54)
AST: 17 U/L (ref 15–41)
Anion gap: 7 (ref 5–15)
BILIRUBIN TOTAL: 0.6 mg/dL (ref 0.3–1.2)
BUN: 8 mg/dL (ref 6–20)
CALCIUM: 9 mg/dL (ref 8.9–10.3)
CO2: 25 mmol/L (ref 22–32)
Chloride: 105 mmol/L (ref 101–111)
Creatinine, Ser: 0.81 mg/dL (ref 0.44–1.00)
GFR calc Af Amer: >60 mL/min (ref >60)
GFR calc non Af Amer: >60 mL/min (ref >60)
GLUCOSE: 113 mg/dL — AB (ref 65–99)
Potassium: 3.7 mmol/L (ref 3.5–5.1)
Sodium: 137 mmol/L (ref 135–145)
TOTAL PROTEIN: 7.1 g/dL (ref 6.5–8.1)

## 2017-03-05 LAB — URINALYSIS, COMPLETE (UACMP) WITH MICROSCOPIC
Bilirubin Urine: NEGATIVE
GLUCOSE, UA: NEGATIVE mg/dL
HGB URINE DIPSTICK: NEGATIVE
Ketones, ur: NEGATIVE mg/dL
Leukocytes, UA: NEGATIVE
NITRITE: NEGATIVE
Protein, ur: NEGATIVE mg/dL
SPECIFIC GRAVITY, URINE: 1.014 (ref 1.005–1.030)
pH: 6 (ref 5.0–8.0)

## 2017-03-05 LAB — CBC
HCT: 38.7 % (ref 35.0–47.0)
Hemoglobin: 12.9 g/dL (ref 12.0–16.0)
MCH: 28.6 pg (ref 26.0–34.0)
MCHC: 33.2 g/dL (ref 32.0–36.0)
MCV: 86.3 fL (ref 80.0–100.0)
Platelets: 375 10*3/uL (ref 150–440)
RBC: 4.49 MIL/uL (ref 3.80–5.20)
RDW: 13.3 % (ref 11.5–14.5)
WBC: 8.3 10*3/uL (ref 3.6–11.0)

## 2017-03-05 LAB — HCG, QUANTITATIVE, PREGNANCY: HCG, BETA CHAIN, QUANT, S: 3417 m[IU]/mL — AB (ref <5)

## 2017-03-05 LAB — LIPASE, BLOOD: Lipase: 30 U/L (ref 11–51)

## 2017-03-05 MED ORDER — NITROFURANTOIN MONOHYD MACRO 100 MG PO CAPS: 100.0000 mg | ORAL_CAPSULE | Freq: Two times a day (BID) | ORAL | 0 refills | Status: AC

## 2017-03-05 MED ORDER — ACETAMINOPHEN 500 MG PO TABS
1000.0000 mg | ORAL_TABLET | Freq: Once | ORAL | Status: AC
  Administered 2017-03-05: 1000 mg via ORAL

## 2017-03-05 MED ORDER — ACETAMINOPHEN 500 MG PO TABS
ORAL_TABLET | ORAL | Status: AC
  Administered 2017-03-05: 1000 mg via ORAL
  Filled 2017-03-05: qty 2

## 2017-03-05 MED ORDER — BUTALBITAL-APAP-CAFFEINE 50-325-40 MG PO TABS: 1.0000 | ORAL_TABLET | Freq: Four times a day (QID) | ORAL | 0 refills | Status: DC | PRN

## 2017-03-05 NOTE — ED Notes (Signed)
Pt back from ultrasound.

## 2017-03-05 NOTE — Patient Instructions (Signed)

## 2017-03-05 NOTE — Progress Notes (Signed)
GYN ENCOUNTER NOTE  Subjective:       Natalee Tomkiewicz is a 21 y.o. G71P1 female is here for gynecologic evaluation of the following issues:  1. She was seen on the 18th for pregnancy confirmation. She had hcg   Of 29 in ed on 14th. Repeat hcg on 18th 154. The plan is to repeat beta hcg on around the 26th ( 1 wk). Reviewed plan of care, she verbalizes understanding. She states that she has also been having bad headaches. The headaches are 8-10 on pain scale. They are sensitive to light.  Went to the ED on 9/20 and they did not do anything for her. She also admits to abdominal cramping with no bleeding    Gynecologic History Patient's last menstrual period was 01/04/2017 (exact date). Contraception: none  Obstetric History OB History  Gravida Para Term Preterm AB Living  SAB TAB Ectopic Multiple Live Births          1    # Outcome Date GA Lbr Len/2nd Weight Sex Delivery Anes PTL Lv  3 Current           2 Para 2015    F Vag-Spont   LIV  1 Gravida               Past Medical History:  Diagnosis Date  . HSV (herpes simplex virus) infection     No past surgical history on file.  No current outpatient prescriptions on file prior to visit.   No current facility-administered medications on file prior to visit.     No Known Allergies  Social History   Social History  . Marital status: Married    Spouse name: N/A  . Number of children: N/A  . Years of education: N/A   Occupational History  . Not on file.   Social History Main Topics  . Smoking status: Never Smoker  . Smokeless tobacco: Never Used  . Alcohol use No  . Drug use: No  . Sexual activity: Yes    Birth control/ protection: None   Other Topics Concern  . Not on file   Social History Narrative  . No narrative on file    No family history on file.  The following portions of the patient's history were reviewed and updated as appropriate: allergies, current medications, past family history,  past medical history, past social history, past surgical history and problem list.  Review of Systems Review of Systems - Negative except as mentioned in HPI Review of Systems - General ROS: negative for - chills, fatigue, fever, hot flashes, malaise or night sweats Hematological and Lymphatic ROS: negative for - bleeding problems or swollen lymph nodes Gastrointestinal ROS: negative for - abdominal pain, blood in stools, change in bowel habits and nausea/vomiting Musculoskeletal ROS: negative for - joint pain, muscle pain or muscular weakness Genito-Urinary ROS: negative for - change in menstrual cycle, dysmenorrhea, dyspareunia, dysuria, genital discharge, genital ulcers, hematuria, incontinence, irregular/heavy menses, nocturia or pelvic painjj  Objective:   BP 120/82 (BP Location: Left Arm, Patient Position: Sitting, Cuff Size: Normal)   Pulse 80   Ht 5' (1.524 m)   Wt 200 lb 11.2 oz (91 kg)   LMP 01/04/2017 (Exact Date)   BMI 39.20 kg/m  CONSTITUTIONAL: Well-developed, well-nourished female in no acute distress.  HENT:  Normocephalic, atraumatic.  NECK: Normal range of motion SKIN: Skin is warm and dry. No rash noted. Not diaphoretic. No erythema.  No pallor. NEUROLGIC: Alert and oriented to person, place, and time . PSYCHIATRIC: Normal mood and affect. Normal behavior. Normal judgment and thought content. CARDIOVASCULAR:Not Examined RESPIRATORY: Not Examined BREASTS: Not Examined ABDOMEN: Soft, non distended; Non tender.  No Organomegaly. PELVIC: not indicated  MUSCULOSKELETAL: Normal range of motion. No tenderness.  No cyanosis, clubbing, or edema.   Assessment:   Migraine headaches   Plan:   Prescription for fioricet given with instructions to only use if necessary. Reviewed benefits and risks of medication in pregnancy during first trimester. She verbalizes understanding and argee to plan. Encouraged use of PO hydration, tylenol, decrease in bright lights and sounds  when she has headache. She will return on or around 26th for repeat beta hCG. Red flag symptoms reviewed.   I attest more than 50% of visit spent reviewing records form ED, discussing plan of care and migraines in pregnancy.   Doreene Burke, CNN

## 2017-03-05 NOTE — ED Notes (Signed)
Pt had emergency with other child and had to leave without talking to Dr. Lenard Lance. Pt will f/u with OB/GYN with questions.

## 2017-03-05 NOTE — Discharge Instructions (Signed)
Your workup today shows a possible early pregnancy at 5 weeks and 1 day. Please follow-up with an OB/GYN for repeat ultrasound in 2 weeks. Please take your antibiotics as prescribed for the next 7 days to treat her urinary tract infection. Return to the emergency department for any worsening pain, or any other symptom personally concerning to yourself.

## 2017-03-05 NOTE — ED Notes (Signed)
Pt requests to speak with Dr. Lenard Lance prior to leaving.

## 2017-03-05 NOTE — ED Notes (Addendum)
Pt c/o lower mid abdominal pain x several days. Pt states she knows she is pregnant but does not know how far along she is. Reports last period was in July. This is pt's second pregnancy. Second child is 22 years old.

## 2017-03-05 NOTE — ED Triage Notes (Signed)
Says suprapubic pain x 2 days.  Went to dr office today and they did urine test, but would not do ultrasound, so she came here as she is 8 week preg.

## 2017-03-05 NOTE — ED Provider Notes (Signed)
[facility] Emergency Department Provider Note  Time seen: 10:14 AM  I have reviewed the triage vital signs and the nursing notes.   HISTORY  Chief Complaint Abdominal Pain    HPI Paige Alexander is a 21 y.o. female G2 P1 who presents to the emergency department for lower abdominal discomfort. According to the patient for the past 2 days she has been experiencing lower abdominal pain and cramping. Patient states she is pregnant but does not know how far long she is. Denies any dysuria, hematuria, vaginal bleeding or discharge. Denies nausea, vomiting or diarrhea. Denies fever. Patient states she had an ultrasound performed several weeks ago but they could not find a pregnancy at that time.  Past Medical History:  Diagnosis Date  . HSV (herpes simplex virus) infection     Patient Active Problem List   Diagnosis Date Noted  . Depression, major, single episode, moderate (HCC) 03/15/2015  . PTSD (post-traumatic stress disorder) 03/15/2015  . Insomnia 03/15/2015    History reviewed. No pertinent surgical history.  Prior to Admission medications   Medication Sig Start Date End Date Taking? Authorizing Provider  butalbital-acetaminophen-caffeine (FIORICET, ESGIC) [id number] MG tablet Take 1-2 tablets by mouth every 6 (six) hours as needed for headache. 03/05/17 03/05/18  Doreene Burke, CNM    No Known Allergies  No family history on file.  Social History Social History  Substance Use Topics  . Smoking status: Never Smoker  . Smokeless tobacco: Never Used  . Alcohol use No    Review of Systems Constitutional: Negative for fever. Cardiovascular: Negative for chest pain. Respiratory: Negative for shortness of breath. Gastrointestinal: Positive for lower abdominal pain/cramping. Negative for nausea vomiting or diarrhea Genitourinary: Negative for dysuria. All other ROS negative  ____________________________________________   PHYSICAL  EXAM:  VITAL SIGNS: ED Triage Vitals  Enc Vitals Group     BP 03/05/17 0912 119/71     Pulse Rate 03/05/17 0912 92     Resp 03/05/17 0912 18     Temp 03/05/17 0912 98.6 F (37 C)     Temp Source 03/05/17 0912 Oral     SpO2 03/05/17 0912 99 %     Weight --      Height --      Head Circumference --      Peak Flow --      Pain Score 03/05/17 0914 8     Pain Loc --      Pain Edu? --      Excl. in GC? --     Constitutional: Alert and oriented. Well appearing and in no distress. Eyes: Normal exam ENT   Head: Normocephalic and atraumatic.   Mouth/Throat: Mucous membranes are moist. Cardiovascular: Normal rate, regular rhythm. No murmur Respiratory: Normal respiratory effort without tachypnea nor retractions. Breath sounds are clear  Gastrointestinal: Soft, slight suprapubic tenderness, no rebound or guarding. No distention. Musculoskeletal: Nontender with normal range of motion in all extremities.  Neurologic:  Normal speech and language. No gross focal neurologic deficits Skin:  Skin is warm, dry and intact.  Psychiatric: Mood and affect are normal.  ____________________________________________     RADIOLOGY  IMPRESSION: Possible very early intrauterine gestation, 5 weeks 1 day by mean sac diameter. Recommend follow-up in 10-14 days to ensure expected progression.  ____________________________________________   INITIAL IMPRESSION / ASSESSMENT AND PLAN / ED COURSE  Pertinent labs & imaging results that were available during my care of the patient were reviewed by me and considered in my  medical decision making (see chart for details).  Patient presents to the emergency department for lower abdominal pain/cramping for the past 2 days. Is pregnant per patient. We will check labs including a quantitative beta hCG. If positive we'll proceed with an ultrasound. At this time differential includes urinary tract infection, ectopic pregnancy, among other intra-abdominal  pathology. Labs are pending at this time.  Patient's labs are largely within normal limits besides many bacteria and 6-30 white blood cells on urinalysis indicating urinary tract infection. Urine culture has been sent. Beta Quant is pending at this time.  Patient's ultrasound shows possible very early IUP with a gestational sac. This would possibly correlate with the low beta hCG 3400. We'll have the patient follow-up with an OB/GYN for recheck. I discussed return precautions for any worsening pain. Patient agreeable to plan.  ____________________________________________   FINAL CLINICAL IMPRESSION(S) / ED DIAGNOSES  Urinary tract infection Possible early pregnancy   Minna Antis, MD 03/05/17 1147

## 2017-03-06 ENCOUNTER — Other Ambulatory Visit: Payer: Self-pay | Admitting: Certified Nurse Midwife

## 2017-03-06 DIAGNOSIS — N926 Irregular menstruation, unspecified: Secondary | ICD-10-CM

## 2017-03-06 NOTE — Progress Notes (Signed)
Ultrasound for dating ordered per Hcg result done at ed yesterday.   Doreene Burke, CNM

## 2017-03-07 ENCOUNTER — Encounter: Payer: Self-pay | Admitting: Certified Nurse Midwife

## 2017-03-07 ENCOUNTER — Telehealth: Payer: Self-pay | Admitting: Emergency Medicine

## 2017-03-07 LAB — URINE CULTURE

## 2017-03-07 NOTE — Telephone Encounter (Signed)
Called patient to inform of urine culture result and need for amoxicillin.  Called in amoxicillin 500 mg twice daily for [facility] [address] per dr statford.

## 2017-03-19 ENCOUNTER — Encounter: Payer: Self-pay | Admitting: Certified Nurse Midwife

## 2017-03-19 ENCOUNTER — Ambulatory Visit (INDEPENDENT_AMBULATORY_CARE_PROVIDER_SITE_OTHER): Payer: Medicaid Other | Admitting: Certified Nurse Midwife

## 2017-03-19 ENCOUNTER — Ambulatory Visit (INDEPENDENT_AMBULATORY_CARE_PROVIDER_SITE_OTHER): Payer: Medicaid Other

## 2017-03-19 VITALS — BP 102/69 | HR 86 | Ht 60.0 in | Wt 198.2 lb

## 2017-03-19 DIAGNOSIS — N926 Irregular menstruation, unspecified: Secondary | ICD-10-CM

## 2017-03-19 DIAGNOSIS — Z8759 Personal history of other complications of pregnancy, childbirth and the puerperium: Secondary | ICD-10-CM | POA: Diagnosis not present

## 2017-03-19 DIAGNOSIS — O3680X0 Pregnancy with inconclusive fetal viability, not applicable or unspecified: Secondary | ICD-10-CM

## 2017-03-19 MED ORDER — CITRANATAL B-CALM 20-1 MG & 2 X 25 MG PO MISC: 25.0000 mg | Freq: Every day | ORAL | 11 refills | Status: AC

## 2017-03-19 MED ORDER — BUTALBITAL-APAP-CAFFEINE 50-325-40 MG PO TABS: 1.0000 | ORAL_TABLET | Freq: Four times a day (QID) | ORAL | 0 refills | Status: DC | PRN

## 2017-03-19 NOTE — Progress Notes (Signed)
Paige Alexander is a 21 y.o. G69P1 female she presents today for follow up ultrasound. Ultrasound today givers her an EDD of 11/05/17 making her 7 wks. Reviewed plan of care. She will schedule an nurse visit for 2-3 wks then NOB physical exam @ 12 wks. She agrees to plan.   Doreene Burke, CNM   LTRASOUND REPORT  Location: [facility] Date of Service: 03/19/17  Indications: Dating/Viability Findings:  Mason Jim intrauterine pregnancy is visualized with a CRL consistent with [redacted] weeks gestation, giving an (U/S) EDD of 11/05/17. The (U/S) EDD IS NOT consistent with the clinically established (LMP) EDD of 10/11/17.  FHR: 135 BPM CRL measurement: 9.2 mm A yolk sac is not obviously apparent on today's scan. Anatomy appears normal for a 7 week fetal pole. A small SCH is noted measuring 9.0 x 3.0 x 12.0 mm.  Right Ovary measures 3.2 x 2.5 x 2.4 cm, and appears WNL. Left Ovary measures 3.1 x 1.7 x 2.8 cm, and appears WNL. There is no obvious evidence of a corpus luteal cyst. Survey of the adnexa demonstrates no adnexal masses. There is no free peritoneal fluid in the cul de sac.  Impression: 1. 7 week Viable Singleton Intrauterine pregnancy by U/S. 2. (U/S) EDD IS NOT consistent with Clinically established (LMP) EDD of 10/11/17. New EDD is by today's ultrasound at 11/05/17.  Recommendations: 1.Clinical correlation with the patient's History and Physical Exam.  Revonda Humphrey, RDMS, RVT

## 2017-03-19 NOTE — Patient Instructions (Signed)

## 2017-04-06 ENCOUNTER — Encounter: Payer: Self-pay | Admitting: Emergency Medicine

## 2017-04-06 ENCOUNTER — Emergency Department
Admission: EM | Admit: 2017-04-06 | Discharge: 2017-04-06 | Disposition: A | Discharge status: Home / Self Care | Payer: Medicaid Other | Attending: Emergency Medicine | Admitting: Emergency Medicine

## 2017-04-06 DIAGNOSIS — O26891 Other specified pregnancy related conditions, first trimester: Secondary | ICD-10-CM | POA: Insufficient documentation

## 2017-04-06 DIAGNOSIS — H9201 Otalgia, right ear: Secondary | ICD-10-CM | POA: Diagnosis present

## 2017-04-06 DIAGNOSIS — Z3A09 9 weeks gestation of pregnancy: Secondary | ICD-10-CM | POA: Diagnosis not present

## 2017-04-06 DIAGNOSIS — J0100 Acute maxillary sinusitis, unspecified: Secondary | ICD-10-CM | POA: Insufficient documentation

## 2017-04-06 DIAGNOSIS — Z79899 Other long term (current) drug therapy: Secondary | ICD-10-CM | POA: Diagnosis not present

## 2017-04-06 DIAGNOSIS — H6981 Other specified disorders of Eustachian tube, right ear: Secondary | ICD-10-CM

## 2017-04-06 NOTE — ED Triage Notes (Signed)
Pt c/o right ear pain for couple days.  No drainage. No fevers.

## 2017-04-06 NOTE — ED Notes (Signed)
Pt is [redacted] weeks pregnant  

## 2017-04-06 NOTE — ED Provider Notes (Signed)
[facility] Emergency Department Provider Note  ____________________________________________  Time seen: Approximately 10:36 AM  I have reviewed the triage vital signs and the nursing notes.   HISTORY  Chief Complaint Otalgia    HPI Paige Alexander is a 21 y.o. female that presents to the emergency department for evaluation of right ear pain for 4 days. 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 She didn't feel any pain or hear any noise when it happened. She has felt some popping in her ear since then. No drainage from ear. She has also had nasal congestion since this time. She is [redacted] weeks pregnant. No fever, nausea, vomiting, abdominal pain.   Past Medical History:  Diagnosis Date  . HSV (herpes simplex virus) infection     Patient Active Problem List   Diagnosis Date Noted  . Depression, major, single episode, moderate (HCC) 03/15/2015  . PTSD (post-traumatic stress disorder) 03/15/2015  . Insomnia 03/15/2015    History reviewed. No pertinent surgical history.  Prior to Admission medications   Medication Sig Start Date End Date Taking? Authorizing Provider  butalbital-acetaminophen-caffeine (FIORICET, ESGIC) 50-325-40 MG tablet Take 1-2 tablets by mouth every 6 (six) hours as needed for headache. 03/19/17 03/19/18  Doreene Burkehompson, Annie, CNM  Prenat w/o A FeCbnFeGlu-FA &B6 (CITRANATAL B-CALM) 20-1 & 25 (2) MG MISC Take 25 mg by mouth daily. 03/19/17   Doreene Burkehompson, Annie, CNM  Prenatal Vit-Fe Fumarate-FA (PRENATAL MULTIVITAMIN) TABS tablet Take 1 tablet by mouth daily at 12 noon.    [provider]    Allergies Patient has no known allergies.  Family History  Problem Relation Age of Onset  . Breast cancer Neg Hx   . Ovarian cancer Neg Hx   . Colon cancer Neg Hx     Social History Social History  Substance Use Topics  . Smoking status: Never Smoker  . Smokeless  tobacco: Never Used  . Alcohol use No     Review of Systems  Constitutional: No fever/chills ENT: Positive for congestion Cardiovascular: No chest pain. Respiratory: No cough. No SOB. Gastrointestinal: No abdominal pain.  No nausea, no vomiting.  Musculoskeletal: Negative for musculoskeletal pain. Skin: Negative for rash, abrasions, lacerations, ecchymosis.   ____________________________________________   PHYSICAL EXAM:  VITAL SIGNS: ED Triage Vitals  Enc Vitals Group     BP 04/06/17 0911 113/73     Pulse Rate 04/06/17 0911 80     Resp 04/06/17 0911 16     Temp 04/06/17 0911 98.3 F (36.8 C)     Temp Source 04/06/17 0911 Oral     SpO2 04/06/17 0911 100 %     Weight 04/06/17 0912 198 lb (89.8 kg)     Height 04/06/17 0912 5' (1.524 m)     Head Circumference --      Peak Flow --      Pain Score 04/06/17 0913 8     Pain Loc --      Pain Edu? --      Excl. in GC? --      Constitutional: Alert and oriented. Well appearing and in no acute distress. Eyes: Conjunctivae are normal. PERRL. EOMI. Head: Atraumatic. ENT:      Ears: Tympanic membranes pearly with cone of light. No observed rupture. No drainage in either canal. No tenderness to palpation with pinna or tragus.      Nose: Mild congestion/rhinnorhea.      Mouth/Throat:  Mucous membranes are moist. Oropharynx non-erythematous. Neck: No stridor.  Cardiovascular: Normal rate, regular rhythm.  Good peripheral circulation. Respiratory: Normal respiratory effort without tachypnea or retractions. Lungs CTAB. Good air entry to the bases with no decreased or absent breath sounds. Gastrointestinal: Bowel sounds 4 quadrants. Soft and nontender to palpation. No guarding or rigidity. No palpable masses. No distention.  Musculoskeletal: Full range of motion to all extremities. No gross deformities appreciated. Neurologic:  Normal speech and language. No gross focal neurologic deficits are appreciated.  Skin:  Skin is warm, dry  and intact. No rash noted.   ____________________________________________   LABS (all labs ordered are listed, but only abnormal results are displayed)  Labs Reviewed - No data to display ____________________________________________  EKG   ____________________________________________  RADIOLOGY   No results found.  ____________________________________________    PROCEDURES  Procedure(s) performed:    Procedures    Medications - No data to display   ____________________________________________   INITIAL IMPRESSION / ASSESSMENT AND PLAN / ED COURSE  Pertinent labs & imaging results that were available during my care of the patient were reviewed by me and considered in my medical decision making (see chart for details).  Review of the [place] CSRS was performed in accordance of the [facility] prior to dispensing any controlled drugs.   Patient presented to the emergency department with concerns for ruptured eardrum. Vital signs and exam are reassuring. No rupture is observed on exam. Patient also has had some nasal congestion for 3 days. Symptoms are consistent with eustachian tube dysfunction. Patient is [redacted] weeks pregnant so she will take Tylenol for pain. No pregnancy concerns. Patient is to follow up with PCP as directed. Patient is given ED precautions to return to the ED for any worsening or new symptoms.     ____________________________________________  FINAL CLINICAL IMPRESSION(S) / ED DIAGNOSES  Final diagnoses:  Dysfunction of right eustachian tube  Acute non-recurrent maxillary sinusitis      NEW MEDICATIONS STARTED DURING THIS VISIT:  Discharge Medication List as of 04/06/2017 10:46 AM          This chart was dictated using voice recognition software/Dragon. Despite best efforts to proofread, errors can occur which can change the meaning. Any change was purely unintentional.    Enid Derry, PA-C 04/06/17 1644    Merrily Brittle,  MD 04/07/17 [phone]

## 2017-04-09 ENCOUNTER — Encounter: Payer: Self-pay | Admitting: Obstetrics and Gynecology

## 2017-04-09 ENCOUNTER — Ambulatory Visit: Payer: Medicaid Other | Admitting: Obstetrics and Gynecology

## 2017-04-09 DIAGNOSIS — Z131 Encounter for screening for diabetes mellitus: Secondary | ICD-10-CM

## 2017-04-09 DIAGNOSIS — Z3491 Encounter for supervision of normal pregnancy, unspecified, first trimester: Secondary | ICD-10-CM

## 2017-04-09 NOTE — Patient Instructions (Signed)
First Trimester of Pregnancy The first trimester of pregnancy is from week 1 until the end of week 13 (months 1 through 3). During this time, your baby will begin to develop inside you. At 6-8 weeks, the eyes and face are formed, and the heartbeat can be seen on ultrasound. At the end of 12 weeks, all the baby's organs are formed. Prenatal care is all the medical care you receive before the birth of your baby. Make sure you get good prenatal care and follow all of your doctor's instructions. Follow these instructions at home: Medicines  Take over-the-counter and prescription medicines only as told by your doctor. Some medicines are safe and some medicines are not safe during pregnancy.  Take a prenatal vitamin that contains at least 600 micrograms (mcg) of folic acid.  If you have trouble pooping (constipation), take medicine that will make your stool soft (stool softener) if your doctor approves. Eating and drinking  Eat regular, healthy meals.  Your doctor will tell you the amount of weight gain that is right for you.  Avoid raw meat and uncooked cheese.  If you feel sick to your stomach (nauseous) or throw up (vomit): ? Eat 4 or 5 small meals a day instead of 3 large meals. ? Try eating a few soda crackers. ? Drink liquids between meals instead of during meals.  To prevent constipation: ? Eat foods that are high in fiber, like fresh fruits and vegetables, whole grains, and beans. ? Drink enough fluids to keep your pee (urine) clear or pale yellow. Activity  Exercise only as told by your doctor. Stop exercising if you have cramps or pain in your lower belly (abdomen) or low back.  Do not exercise if it is too hot, too humid, or if you are in a place of great height (high altitude).  Try to avoid standing for long periods of time. Move your legs often if you must stand in one place for a long time.  Avoid heavy lifting.  Wear low-heeled shoes. Sit and stand up straight.  You  can have sex unless your doctor tells you not to. Relieving pain and discomfort  Wear a good support bra if your breasts are sore.  Take warm water baths (sitz baths) to soothe pain or discomfort caused by hemorrhoids. Use hemorrhoid cream if your doctor says it is okay.  Rest with your legs raised if you have leg cramps or low back pain.  If you have puffy, bulging veins (varicose veins) in your legs: ? Wear support hose or compression stockings as told by your doctor. ? Raise (elevate) your feet for 15 minutes, 3-4 times a day. ? Limit salt in your food. Prenatal care  Schedule your prenatal visits by the twelfth week of pregnancy.  Write down your questions. Take them to your prenatal visits.  Keep all your prenatal visits as told by your doctor. This is important. Safety  Wear your seat belt at all times when driving.  Make a list of emergency phone numbers. The list should include numbers for family, friends, the hospital, and police and fire departments. General instructions  Ask your doctor for a referral to a local prenatal class. Begin classes no later than at the start of month 6 of your pregnancy.  Ask for help if you need counseling or if you need help with nutrition. Your doctor can give you advice or tell you where to go for help.  Do not use hot tubs, steam rooms, or   saunas.  Do not douche or use tampons or scented sanitary pads.  Do not cross your legs for long periods of time.  Avoid all herbs and alcohol. Avoid drugs that are not approved by your doctor.  Do not use any tobacco products, including cigarettes, chewing tobacco, and electronic cigarettes. If you need help quitting, ask your doctor. You may get counseling or other support to help you quit.  Avoid cat litter boxes and soil used by cats. These carry germs that can cause birth defects in the baby and can cause a loss of your baby (miscarriage) or stillbirth.  Visit your dentist. At home, brush  your teeth with a soft toothbrush. Be gentle when you floss. Contact a doctor if:  You are dizzy.  You have mild cramps or pressure in your lower belly.  You have a nagging pain in your belly area.  You continue to feel sick to your stomach, you throw up, or you have watery poop (diarrhea).  You have a bad smelling fluid coming from your vagina.  You have pain when you pee (urinate).  You have increased puffiness (swelling) in your face, hands, legs, or ankles. Get help right away if:  You have a fever.  You are leaking fluid from your vagina.  You have spotting or bleeding from your vagina.  You have very bad belly cramping or pain.  You gain or lose weight rapidly.  You throw up blood. It may look like coffee grounds.  You are around people who have German measles, fifth disease, or chickenpox.  You have a very bad headache.  You have shortness of breath.  You have any kind of trauma, such as from a fall or a car accident. Summary  The first trimester of pregnancy is from week 1 until the end of week 13 (months 1 through 3).  To take care of yourself and your unborn baby, you will need to eat healthy meals, take medicines only if your doctor tells you to do so, and do activities that are safe for you and your baby.  Keep all follow-up visits as told by your doctor. This is important as your doctor will have to ensure that your baby is healthy and growing well. This information is not intended to replace advice given to you by your health care provider. Make sure you discuss any questions you have with your health care provider. Document Released: 11/15/2007 Document Revised: 06/06/2016 Document Reviewed: 06/06/2016 Elsevier Interactive Patient Education  2017 [employer]  

## 2017-04-09 NOTE — Progress Notes (Signed)
[name] presents for NOB nurse interview visit. [facility].  G- 3.  P-1 . Pregnancy education material explained and given. _No cats in the home. NOB labs ordered. (TSH/HbgA1c due to Increased BMI), (sickle cell). HIV labs and Drug screen were explained optional and she did not decline. Drug screen ordered.[name]. PNV encouraged. Genetic screening options discussed. Genetic testing/Unsure.  Pt may discuss with provider. Pt. To follow up with provider in _2_ weeks for NOB physical.  All questions answered.

## 2017-04-10 ENCOUNTER — Other Ambulatory Visit: Payer: Self-pay | Admitting: Certified Nurse Midwife

## 2017-04-10 LAB — RUBELLA SCREEN: Rubella Antibodies, IGG: 2.58 index (ref >0.99)

## 2017-04-10 LAB — CBC WITH DIFFERENTIAL/PLATELET
BASOS: 0 %
Basophils Absolute: 0 10*3/uL (ref 0.0–0.2)
EOS (ABSOLUTE): 0.1 10*3/uL (ref 0.0–0.4)
Eos: 1 %
HEMATOCRIT: 39.9 % (ref 34.0–46.6)
HEMOGLOBIN: 13.2 g/dL (ref 11.1–15.9)
IMMATURE GRANS (ABS): 0 10*3/uL (ref 0.0–0.1)
IMMATURE GRANULOCYTES: 0 %
LYMPHS: 31 %
Lymphocytes Absolute: 2.7 10*3/uL (ref 0.7–3.1)
MCH: 28.6 pg (ref 26.6–33.0)
MCHC: 33.1 g/dL (ref 31.5–35.7)
MCV: 86 fL (ref 79–97)
MONOCYTES: 6 %
Monocytes Absolute: 0.6 10*3/uL (ref 0.1–0.9)
NEUTROS ABS: 5.4 10*3/uL (ref 1.4–7.0)
NEUTROS PCT: 62 %
Platelets: 417 10*3/uL — ABNORMAL HIGH (ref 150–379)
RBC: 4.62 x10E6/uL (ref 3.77–5.28)
RDW: 13.8 % (ref 12.3–15.4)
WBC: 8.8 10*3/uL (ref 3.4–10.8)

## 2017-04-10 LAB — URINALYSIS, ROUTINE W REFLEX MICROSCOPIC
BILIRUBIN UA: NEGATIVE
Glucose, UA: NEGATIVE
NITRITE UA: NEGATIVE
RBC UA: NEGATIVE
UUROB: 1 mg/dL (ref 0.2–1.0)
pH, UA: 5.5 (ref 5.0–7.5)

## 2017-04-10 LAB — RPR: RPR: NONREACTIVE

## 2017-04-10 LAB — MICROSCOPIC EXAMINATION: Casts: NONE SEEN /lpf

## 2017-04-10 LAB — SICKLE CELL SCREEN: Sickle Cell Screen: NEGATIVE

## 2017-04-10 LAB — HIV ANTIBODY (ROUTINE TESTING W REFLEX): HIV Screen 4th Generation wRfx: NONREACTIVE

## 2017-04-10 LAB — ABO AND RH: RH TYPE: POSITIVE

## 2017-04-10 LAB — TSH: TSH: 1.48 u[IU]/mL (ref 0.450–4.500)

## 2017-04-10 LAB — HEPATITIS B SURFACE ANTIGEN: Hepatitis B Surface Ag: NEGATIVE

## 2017-04-10 LAB — HEMOGLOBIN A1C
Est. average glucose Bld gHb Est-mCnc: 111 mg/dL
HEMOGLOBIN A1C: 5.5 % (ref 4.8–5.6)

## 2017-04-10 LAB — VARICELLA ZOSTER ANTIBODY, IGG: VARICELLA: 241 {index} (ref >165)

## 2017-04-10 LAB — ANTIBODY SCREEN: Antibody Screen: NEGATIVE

## 2017-04-11 LAB — GC/CHLAMYDIA PROBE AMP
CHLAMYDIA, DNA PROBE: NEGATIVE
Neisseria gonorrhoeae by PCR: NEGATIVE

## 2017-04-11 LAB — URINE CULTURE

## 2017-04-12 ENCOUNTER — Other Ambulatory Visit: Payer: Self-pay | Admitting: Obstetrics and Gynecology

## 2017-04-12 MED ORDER — BUTALBITAL-APAP-CAFFEINE 50-325-40 MG PO TABS: 1.0000 | ORAL_TABLET | Freq: Four times a day (QID) | ORAL | 0 refills | Status: AC | PRN

## 2017-04-16 ENCOUNTER — Encounter: Payer: Self-pay | Admitting: Obstetrics and Gynecology

## 2017-04-23 ENCOUNTER — Encounter: Payer: Self-pay | Admitting: Certified Nurse Midwife

## 2017-04-23 ENCOUNTER — Ambulatory Visit (INDEPENDENT_AMBULATORY_CARE_PROVIDER_SITE_OTHER): Payer: Medicaid Other | Admitting: Certified Nurse Midwife

## 2017-04-23 VITALS — BP 101/68 | HR 95 | Wt 194.6 lb

## 2017-04-23 DIAGNOSIS — Z3491 Encounter for supervision of normal pregnancy, unspecified, first trimester: Secondary | ICD-10-CM

## 2017-04-23 DIAGNOSIS — Z202 Contact with and (suspected) exposure to infections with a predominantly sexual mode of transmission: Secondary | ICD-10-CM

## 2017-04-23 DIAGNOSIS — Z6838 Body mass index (BMI) 38.0-38.9, adult: Secondary | ICD-10-CM

## 2017-04-23 LAB — POCT URINALYSIS DIPSTICK
Bilirubin, UA: NEGATIVE
Glucose, UA: NEGATIVE
Ketones, UA: NEGATIVE
Leukocytes, UA: NEGATIVE
NITRITE UA: NEGATIVE
PROTEIN UA: NEGATIVE
RBC UA: NEGATIVE
SPEC GRAV UA: 1.02 (ref 1.010–1.025)
UROBILINOGEN UA: 0.2 U/dL
pH, UA: 6.5 (ref 5.0–8.0)

## 2017-04-23 NOTE — Patient Instructions (Signed)
Morning Sickness Morning sickness is when you feel sick to your stomach (nauseous) during pregnancy. You may feel sick to your stomach and throw up (vomit). You may feel sick in the morning, but you can feel this way any time of day. Some women feel very sick to their stomach and cannot stop throwing up (hyperemesis gravidarum). Follow these instructions at home:  Only take medicines as told by your doctor.  Take multivitamins as told by your doctor. Taking multivitamins before getting pregnant can stop or lessen the harshness of morning sickness.  Eat dry toast or unsalted crackers before getting out of bed.  Eat 5 to 6 small meals a day.  Eat dry and bland foods like rice and baked potatoes.  Do not drink liquids with meals. Drink between meals.  Do not eat greasy, fatty, or spicy foods.  Have someone cook for you if the smell of food causes you to feel sick or throw up.  If you feel sick to your stomach after taking prenatal vitamins, take them at night or with a snack.  Eat protein when you need a snack (nuts, yogurt, cheese).  Eat unsweetened gelatins for dessert.  Wear a bracelet used for sea sickness (acupressure wristband).  Go to a doctor that puts thin needles into certain body points (acupuncture) to improve how you feel.  Do not smoke.  Use a humidifier to keep the air in your house free of odors.  Get lots of fresh air. Contact a doctor if:  You need medicine to feel better.  You feel dizzy or lightheaded.  You are losing weight. Get help right away if:  You feel very sick to your stomach and cannot stop throwing up.  You pass out (faint). This information is not intended to replace advice given to you by your health care provider. Make sure you discuss any questions you have with your health care provider. Document Released: 07/06/2004 Document Revised: 11/04/2015 Document Reviewed: 11/13/2012 Elsevier Interactive Patient Education  2017 [place] for Pregnant Women While you are pregnant, your body will require additional nutrition to help support your growing baby. It is recommended that you consume:  150 additional calories each day during your first trimester.  300 additional calories each day during your second trimester.  300 additional calories each day during your third trimester.  Eating a healthy, well-balanced diet is very important for your health and for your baby's health. You also have a higher need for some vitamins and minerals, such as folic acid, calcium, iron, and vitamin D. What do I need to know about eating during pregnancy?  Do not try to lose weight or go on a diet during pregnancy.  Choose healthy, nutritious foods. Choose  of a sandwich with a glass of milk instead of a candy bar or a high-calorie sugar-sweetened beverage.  Limit your overall intake of foods that have "empty calories." These are foods that have little nutritional value, such as sweets, desserts, candies, sugar-sweetened beverages, and fried foods.  Eat a variety of foods, especially fruits and vegetables.  Take a prenatal vitamin to help meet the additional needs during pregnancy, specifically for folic acid, iron, calcium, and vitamin D.  Remember to stay active. Ask your health care provider for exercise recommendations that are specific to you.  Practice good food safety and cleanliness, such as washing your hands before you eat and after you prepare raw meat. This helps to prevent foodborne illnesses, such as listeriosis, that can  be very dangerous for your baby. Ask your health care provider for more information about listeriosis. What does 150 extra calories look like? Healthy options for an additional 150 calories each day could be any of the following:  Plain low-fat yogurt (6-8 oz) with  cup of berries.  1 apple with 2 teaspoons of peanut butter.  Cut-up vegetables with  cup of hummus.  Low-fat  chocolate milk (8 oz or 1 cup).  1 string cheese with 1 medium orange.   of a peanut butter and jelly sandwich on whole-wheat bread (1 tsp of peanut butter).  For 300 calories, you could eat two of those healthy options each day. What is a healthy amount of weight to gain? The recommended amount of weight for you to gain is based on your pre-pregnancy BMI. If your pre-pregnancy BMI was:  Less than 18 (underweight), you should gain 28-40 lb.  18-24.9 (normal), you should gain 25-35 lb.  25-29.9 (overweight), you should gain 15-25 lb.  Greater than 30 (obese), you should gain 11-20 lb.  What if I am having twins or multiples? Generally, pregnant women who will be having twins or multiples may need to increase their daily calories by 300-600 calories each day. The recommended range for total weight gain is 25-54 lb, depending on your pre-pregnancy BMI. Talk with your health care provider for specific guidance about additional nutritional needs, weight gain, and exercise during your pregnancy. What foods can I eat? Grains Any grains. Try to choose whole grains, such as whole-wheat bread, oatmeal, or brown rice. Vegetables Any vegetables. Try to eat a variety of colors and types of vegetables to get a full range of vitamins and minerals. Remember to wash your vegetables well before eating. Fruits Any fruits. Try to eat a variety of colors and types of fruit to get a full range of vitamins and minerals. Remember to wash your fruits well before eating. Meats and Other Protein Sources Lean meats, including chicken, [place], fish, and lean cuts of beef, veal, or pork. Make sure that all meats are cooked to "well done." Tofu. Tempeh. Beans. Eggs. Peanut butter and other nut butters. Seafood, such as shrimp, crab, and lobster. If you choose fish, select types that are higher in omega-3 fatty acids, including salmon, herring, mussels, trout, sardines, and pollock. Make sure that all meats are cooked  to food-safe temperatures. Dairy Pasteurized milk and milk alternatives. Pasteurized yogurt and pasteurized cheese. Cottage cheese. Sour cream. Beverages Water. Juices that contain 100% fruit juice or vegetable juice. Caffeine-free teas and decaffeinated coffee. Drinks that contain caffeine are okay to drink, but it is better to avoid caffeine. Keep your total caffeine intake to less than 200 mg each day (12 oz of coffee, tea, or soda) or as directed by your health care provider. Condiments Any pasteurized condiments. Sweets and Desserts Any sweets and desserts. Fats and Oils Any fats and oils. The items listed above may not be a complete list of recommended foods or beverages. Contact your dietitian for more options. What foods are not recommended? Vegetables Unpasteurized (raw) vegetable juices. Fruits Unpasteurized (raw) fruit juices. Meats and Other Protein Sources Cured meats that have nitrates, such as bacon, salami, and hotdogs. Luncheon meats, bologna, or other deli meats (unless they are reheated until they are steaming hot). Refrigerated pate, meat spreads from a meat counter, smoked seafood that is found in the refrigerated section of a store. Raw fish, such as sushi or sashimi. High mercury content fish, such as tilefish,  shark, swordfish, and king mackerel. Raw meats, such as tuna or beef tartare. Undercooked meats and poultry. Make sure that all meats are cooked to food-safe temperatures. Dairy Unpasteurized (raw) milk and any foods that have raw milk in them. Soft cheeses, such as feta, queso blanco, queso fresco, Brie, Camembert cheeses, blue-veined cheeses, and Panela cheese (unless it is made with pasteurized milk, which must be stated on the label). Beverages Alcohol. Sugar-sweetened beverages, such as sodas, teas, or energy drinks. Condiments Homemade fermented foods and drinks, such as pickles, sauerkraut, or kombucha drinks. (Store-bought pasteurized versions of these  are okay.) Other Salads that are made in the store, such as ham salad, chicken salad, egg salad, tuna salad, and seafood salad. The items listed above may not be a complete list of foods and beverages to avoid. Contact your dietitian for more information. This information is not intended to replace advice given to you by your health care provider. Make sure you discuss any questions you have with your health care provider. Document Released: 03/13/2014 Document Revised: 11/04/2015 Document Reviewed: 11/11/2013 Elsevier Interactive Patient Education  2018 [employer]. Common Medications Safe in Pregnancy  Acne:      Constipation:  Benzoyl Peroxide     Colace  Clindamycin      Dulcolax Suppository  Topica Erythromycin     Fibercon  Salicylic Acid      Metamucil         Miralax AVOID:        Senakot   Accutane    Cough:  Retin-A       Cough Drops  Tetracycline      Phenergan w/ Codeine if Rx  Minocycline      Robitussin (Plain & DM)  Antibiotics:     Crabs/Lice:  Ceclor       RID  Cephalosporins    AVOID:  E-Mycins      Kwell  Keflex  Macrobid/Macrodantin   Diarrhea:  Penicillin      Kao-Pectate  Zithromax      Imodium AD         PUSH FLUIDS AVOID:       Cipro     Fever:  Tetracycline      Tylenol (Regular or Extra  Minocycline       Strength)  Levaquin      Extra Strength-Do not          Exceed 8 tabs/24 hrs Caffeine:        <280m/day (equiv. To 1 cup of coffee or  approx. 3 12 oz sodas)         Gas: Cold/Hayfever:       Gas-X  Benadryl      Mylicon  Claritin       Phazyme  **Claritin-D        Chlor-Trimeton    Headaches:  Dimetapp      ASA-Free Excedrin  Drixoral-Non-Drowsy     Cold Compress  Mucinex (Guaifenasin)     Tylenol (Regular or Extra  Sudafed/Sudafed-12 Hour     Strength)  **Sudafed PE Pseudoephedrine   Tylenol Cold & Sinus     Vicks Vapor Rub  Zyrtec  **AVOID if Problems With Blood Pressure         Heartburn: Avoid lying down for at least 1  hour after meals  Aciphex      Maalox     Rash:  Milk of Magnesia     Benadryl    Mylanta  1% Hydrocortisone Cream  Pepcid  Pepcid Complete   Sleep Aids:  Prevacid      Ambien   Prilosec       Benadryl  Rolaids       Chamomile Tea  Tums (Limit 4/day)     Unisom  Zantac       Tylenol PM         Warm milk-add vanilla or  Hemorrhoids:       Sugar for taste  Anusol/Anusol H.C.  (RX: Analapram 2.5%)  Sugar Substitutes:  Hydrocortisone OTC     Ok in moderation  Preparation H      Tucks        Vaseline lotion applied to tissue with wiping    Herpes:     Throat:  Acyclovir      Oragel  Famvir  Valtrex     Vaccines:         Flu Shot Leg Cramps:       *Gardasil  Benadryl      Hepatitis A         Hepatitis B Nasal Spray:       Pneumovax  Saline Nasal Spray     Polio Booster         Tetanus Nausea:       Tuberculosis test or PPD  Vitamin B6 25 mg TID   AVOID:    Dramamine      *Gardasil  Emetrol       Live Poliovirus  Ginger Root 250 mg QID    MMR (measles, mumps &  High Complex Carbs @ Bedtime    rebella)  Sea Bands-Accupressure    Varicella (Chickenpox)  Unisom 1/2 tab TID     *No known complications           If received before Pain:         Known pregnancy;   Darvocet       Resume series after  Lortab        Delivery  Percocet    Yeast:   Tramadol      Femstat  Tylenol 3      Gyne-lotrimin  Ultram       Monistat  Vicodin           MISC:         All Sunscreens           Hair Coloring/highlights          Insect Repellant's          (Including DEET)         Mystic Tans Second Trimester of Pregnancy The second trimester is from week 13 through week 28, month 4 through 6. This is often the time in pregnancy that you feel your best. Often times, morning sickness has lessened or quit. You may have more energy, and you may get hungry more often. Your unborn baby (fetus) is growing rapidly. At the end of the sixth month, he or she is about 9 inches long and weighs about 1  pounds. You will likely feel the baby move (quickening) between 18 and 20 weeks of pregnancy. Follow these instructions at home:  Avoid all smoking, herbs, and alcohol. Avoid drugs not approved by your doctor.  Do not use any tobacco products, including cigarettes, chewing tobacco, and electronic cigarettes. If you need help quitting, ask your doctor. You may get counseling or other support to help you quit.  Only take medicine as told by your doctor.  Some medicines are safe and some are not during pregnancy.  Exercise only as told by your doctor. Stop exercising if you start having cramps.  Eat regular, healthy meals.  Wear a good support bra if your breasts are tender.  Do not use hot tubs, steam rooms, or saunas.  Wear your seat belt when driving.  Avoid raw meat, uncooked cheese, and liter boxes and soil used by cats.  Take your prenatal vitamins.  Take 1500-2000 milligrams of calcium daily starting at the 20th week of pregnancy until you deliver your baby.  Try taking medicine that helps you poop (stool softener) as needed, and if your doctor approves. Eat more fiber by eating fresh fruit, vegetables, and whole grains. Drink enough fluids to keep your pee (urine) clear or pale yellow.  Take warm water baths (sitz baths) to soothe pain or discomfort caused by hemorrhoids. Use hemorrhoid cream if your doctor approves.  If you have puffy, bulging veins (varicose veins), wear support hose. Raise (elevate) your feet for 15 minutes, 3-4 times a day. Limit salt in your diet.  Avoid heavy lifting, wear low heals, and sit up straight.  Rest with your legs raised if you have leg cramps or low back pain.  Visit your dentist if you have not gone during your pregnancy. Use a soft toothbrush to brush your teeth. Be gentle when you floss.  You can have sex (intercourse) unless your doctor tells you not to.  Go to your doctor visits. Get help if:  You feel dizzy.  You have mild  cramps or pressure in your lower belly (abdomen).  You have a nagging pain in your belly area.  You continue to feel sick to your stomach (nauseous), throw up (vomit), or have watery poop (diarrhea).  You have bad smelling fluid coming from your vagina.  You have pain with peeing (urination). Get help right away if:  You have a fever.  You are leaking fluid from your vagina.  You have spotting or bleeding from your vagina.  You have severe belly cramping or pain.  You lose or gain weight rapidly.  You have trouble catching your breath and have chest pain.  You notice sudden or extreme puffiness (swelling) of your face, hands, ankles, feet, or legs.  You have not felt the baby move in over an hour.  You have severe headaches that do not go away with medicine.  You have vision changes. This information is not intended to replace advice given to you by your health care provider. Make sure you discuss any questions you have with your health care provider. Document Released: 08/23/2009 Document Revised: 11/04/2015 Document Reviewed: 07/30/2012 Elsevier Interactive Patient Education  2017 [employer].

## 2017-04-23 NOTE — Progress Notes (Signed)
NEW OB HISTORY AND PHYSICAL  SUBJECTIVE:       Paige Alexander is a 21 y.o. 53P1 female, Patient's last menstrual period was 01/29/2017., Estimated Date of Delivery: 11/05/17, [id number], presents today for establishment of Prenatal Care.  She has no unusual complaints and complains of fatigue and occasional lower abdominal cramping.  Denies difficulty breathing or respiratory distress, chest pain, abdominal pain, vaginal bleeding, dysuria, and leg pain or swelling.   Desires genetic screening. Questions previous diagnosis of HSV.    Gynecologic History  Patient's last menstrual period was 01/29/2017.   Contraception: none  Last Pap: due.  Obstetric History  OB History  Gravida Para Term Preterm AB Living  3 1       1   SAB TAB Ectopic Multiple Live Births        0 1    # Outcome Date GA Lbr Len/2nd Weight Sex Delivery Anes PTL Lv  3 Current           2 Para 2015    F Vag-Spont   LIV  1 Gravida               Past Medical History:  Diagnosis Date  . HSV (herpes simplex virus) infection     No past surgical history on file.  Current Outpatient Medications on File Prior to Visit  Medication Sig Dispense Refill  . butalbital-acetaminophen-caffeine (FIORICET, ESGIC) 50-325-40 MG tablet Take 1-2 tablets by mouth every 6 (six) hours as needed for headache. 20 tablet 0  . Prenat w/o A FeCbnFeGlu-FA &B6 (CITRANATAL B-CALM) 20-1 & 25 (2) MG MISC Take 25 mg by mouth daily. 30 each 11   No current facility-administered medications on file prior to visit.    No Known Allergies  Social History   Socioeconomic History  . Marital status: Married    Spouse name: Not on file  . Number of children: Not on file  . Years of education: Not on file  . Highest education level: Not on file  Social Needs  . Financial resource strain: Not on file  . Food insecurity - worry: Not on file  . Food insecurity - inability: Not on file  . Transportation needs - medical: Not on file  .  Transportation needs - non-medical: Not on file  Occupational History  . Not on file  Tobacco Use  . Smoking status: Never Smoker  . Smokeless tobacco: Never Used  Substance and Sexual Activity  . Alcohol use: No  . Drug use: No  . Sexual activity: Yes    Birth control/protection: None  Other Topics Concern  . Not on file  Social History Narrative  . Not on file    Family History  Problem Relation Age of Onset  . Breast cancer Neg Hx   . Ovarian cancer Neg Hx   . Colon cancer Neg Hx     The following portions of the patient's history were reviewed and updated as appropriate: allergies, current medications, past OB history, past medical history, past surgical history, past family history, past social history, and problem list.    OBJECTIVE:  BP 101/68   Pulse 95   Wt 194 lb 9 oz (88.3 kg)   LMP 01/29/2017   BMI 38.00 kg/m   Initial Physical Exam (New OB)  GENERAL APPEARANCE: alert, well appearing, in no apparent distress  HEAD: normocephalic, atraumatic  MOUTH: mucous membranes moist, pharynx normal without lesions and dental hygiene good  THYROID: no thyromegaly or masses present  BREASTS: no masses noted, no significant tenderness, no palpable axillary nodes, no skin changes  LUNGS: clear to auscultation, no wheezes, rales or rhonchi, symmetric air entry  HEART: regular rate and rhythm, no murmurs  ABDOMEN: soft, nontender, nondistended, no abnormal masses, no epigastric pain, obese and FHT present  EXTREMITIES: no redness or tenderness in the calves or thighs, no edema  SKIN: normal coloration and turgor, no rashes  LYMPH NODES: no adenopathy palpable  NEUROLOGIC: alert, oriented, normal speech, no focal findings or movement disorder noted  PELVIC EXAM EXTERNAL GENITALIA: normal appearing vulva with no masses, tenderness or lesions VAGINA: no abnormal discharge or lesions CERVIX: no lesions or cervical motion tenderness and Pap collected UTERUS:  gravid and consistent with 12 weeks ADNEXA: no masses palpable and nontender OB EXAM PELVIMETRY: appears adequate  ASSESSMENT: Normal pregnancy BMI>30 Desires genetic screening  PLAN: Prenatal care Panorama today Glucola and HSV next visit New OB counseling: The patient has been given an overview regarding routine prenatal care. Recommendations regarding diet, weight gain, and exercise in pregnancy were given. Prenatal testing, optional genetic testing, and ultrasound use in pregnancy were reviewed.  Benefits of Breast Feeding were discussed. The patient is encouraged to consider nursing her baby post partum. See orders   Gunnar BullaJenkins Michelle Dariyon Urquilla, CNM

## 2017-04-23 NOTE — Progress Notes (Signed)
Pt is here for a new OB physical visit and needs a script for acyclovir.

## 2017-04-24 ENCOUNTER — Encounter: Payer: Self-pay | Admitting: Emergency Medicine

## 2017-04-24 ENCOUNTER — Emergency Department: Payer: Medicaid Other

## 2017-04-24 ENCOUNTER — Emergency Department
Admission: EM | Admit: 2017-04-24 | Discharge: 2017-04-24 | Disposition: A | Discharge status: Home / Self Care | Payer: Medicaid Other | Attending: Emergency Medicine | Admitting: Emergency Medicine

## 2017-04-24 ENCOUNTER — Other Ambulatory Visit: Payer: Self-pay

## 2017-04-24 DIAGNOSIS — R1030 Lower abdominal pain, unspecified: Secondary | ICD-10-CM | POA: Insufficient documentation

## 2017-04-24 DIAGNOSIS — O200 Threatened abortion: Secondary | ICD-10-CM | POA: Diagnosis not present

## 2017-04-24 DIAGNOSIS — Z79899 Other long term (current) drug therapy: Secondary | ICD-10-CM | POA: Diagnosis not present

## 2017-04-24 DIAGNOSIS — Z3A12 12 weeks gestation of pregnancy: Secondary | ICD-10-CM | POA: Insufficient documentation

## 2017-04-24 DIAGNOSIS — O9989 Other specified diseases and conditions complicating pregnancy, childbirth and the puerperium: Secondary | ICD-10-CM | POA: Insufficient documentation

## 2017-04-24 DIAGNOSIS — O209 Hemorrhage in early pregnancy, unspecified: Secondary | ICD-10-CM | POA: Diagnosis present

## 2017-04-24 DIAGNOSIS — O4690 Antepartum hemorrhage, unspecified, unspecified trimester: Secondary | ICD-10-CM

## 2017-04-24 LAB — ABO/RH: ABO/RH(D): AB POS

## 2017-04-24 LAB — CBC
HEMATOCRIT: 38.1 % (ref 35.0–47.0)
HEMOGLOBIN: 12.5 g/dL (ref 12.0–16.0)
MCH: 29 pg (ref 26.0–34.0)
MCHC: 32.9 g/dL (ref 32.0–36.0)
MCV: 88.1 fL (ref 80.0–100.0)
Platelets: 365 10*3/uL (ref 150–440)
RBC: 4.33 MIL/uL (ref 3.80–5.20)
RDW: 13.2 % (ref 11.5–14.5)
WBC: 8.7 10*3/uL (ref 3.6–11.0)

## 2017-04-24 LAB — URINALYSIS, COMPLETE (UACMP) WITH MICROSCOPIC
BACTERIA UA: NONE SEEN
BILIRUBIN URINE: NEGATIVE
Glucose, UA: NEGATIVE mg/dL
KETONES UR: NEGATIVE mg/dL
NITRITE: NEGATIVE
PROTEIN: 30 mg/dL — AB
Specific Gravity, Urine: 1.024 (ref 1.005–1.030)
pH: 5 (ref 5.0–8.0)

## 2017-04-24 LAB — PREGNANCY, URINE: PREG TEST UR: POSITIVE — AB

## 2017-04-24 LAB — WET PREP, GENITAL
Clue Cells Wet Prep HPF POC: NONE SEEN
SPERM: NONE SEEN
Trich, Wet Prep: NONE SEEN
Yeast Wet Prep HPF POC: NONE SEEN

## 2017-04-24 LAB — CHLAMYDIA/NGC RT PCR (ARMC ONLY)
CHLAMYDIA TR: NOT DETECTED
N GONORRHOEAE: NOT DETECTED

## 2017-04-24 LAB — POCT PREGNANCY, URINE: Preg Test, Ur: POSITIVE — AB

## 2017-04-24 LAB — HCG, QUANTITATIVE, PREGNANCY: HCG, BETA CHAIN, QUANT, S: 199370 m[IU]/mL — AB (ref <5)

## 2017-04-24 MED ORDER — ACETAMINOPHEN 325 MG PO TABS
650.0000 mg | ORAL_TABLET | Freq: Once | ORAL | Status: AC
Start: 2017-04-24 — End: 2017-04-24
  Administered 2017-04-24: 650 mg via ORAL
  Filled 2017-04-24: qty 2

## 2017-04-24 NOTE — ED Notes (Signed)
Pt requested to know her test results. Informed pt that this nurse will be in to speak with her soon.

## 2017-04-24 NOTE — ED Notes (Signed)
When this nurse entered room to discharge pt, she was gone. Discharge papers filed in chart

## 2017-04-24 NOTE — ED Provider Notes (Signed)
[facility] Emergency Department Provider Note   ____________________________________________   First MD Initiated Contact with Patient 04/24/17 0330     (approximate)  I have reviewed the triage vital signs and the nursing notes.   HISTORY  Chief Complaint Abdominal Pain and Vaginal Bleeding    HPI Paige Alexander is a 21 y.o. female who comes into the hospital today with some lower abdominal cramping and vaginal bleeding.  The patient saw her doctor yesterday and had a Pap smear done.  She was told that she would have some bleeding for about an hour but she reports that she has had some significant lower abdominal cramping and some heavy bleeding.  She reports that it is like a period.  The patient states that she started having some blood clots.  The patient rates her pain a 9 out of 10 in intensity.  She did not take any medication for her pain prior to coming in.  The patient is [redacted] weeks pregnant.  She is a G2 P1 001.  She is here today for evaluation.  She has had no nausea or vomiting.    Past Medical History:  Diagnosis Date  . HSV (herpes simplex virus) infection     Patient Active Problem List   Diagnosis Date Noted  . Depression, major, single episode, moderate (HCC) 03/15/2015  . PTSD (post-traumatic stress disorder) 03/15/2015  . Insomnia 03/15/2015    History reviewed. No pertinent surgical history.  Prior to Admission medications   Medication Sig Start Date End Date Taking? Authorizing Provider  butalbital-acetaminophen-caffeine (FIORICET, ESGIC) 50-325-40 MG tablet Take 1-2 tablets by mouth every 6 (six) hours as needed for headache. 04/12/17 04/12/18  Shambley, Melody N, CNM  Prenat w/o A FeCbnFeGlu-FA &B6 (CITRANATAL B-CALM) 20-1 & 25 (2) MG MISC Take 25 mg by mouth daily. 03/19/17   Doreene Burkehompson, Annie, CNM    Allergies Patient has no known allergies.  Family History  Problem Relation Age of Onset  . Breast cancer Neg Hx   .  Ovarian cancer Neg Hx   . Colon cancer Neg Hx     Social History Social History   Tobacco Use  . Smoking status: Never Smoker  . Smokeless tobacco: Never Used  Substance Use Topics  . Alcohol use: No  . Drug use: No    Review of Systems  Constitutional: No fever/chills Eyes: No visual changes. ENT: No sore throat. Cardiovascular: Denies chest pain. Respiratory: Denies shortness of breath. Gastrointestinal: abdominal pain.  No nausea, no vomiting.  No diarrhea.  No constipation. Genitourinary: Vaginal bleeding Musculoskeletal: Negative for back pain. Skin: Negative for rash. Neurological: Negative for headaches, focal weakness or numbness.   ____________________________________________   PHYSICAL EXAM:  VITAL SIGNS: ED Triage Vitals  Enc Vitals Group     BP 04/24/17 0318 120/76     Pulse Rate 04/24/17 0318 93     Resp 04/24/17 0318 18     Temp 04/24/17 0318 98 F (36.7 C)     Temp Source 04/24/17 0318 Oral     SpO2 04/24/17 0318 97 %     Weight 04/24/17 0315 194 lb (88 kg)     Height 04/24/17 0315 5' (1.524 m)     Head Circumference --      Peak Flow --      Pain Score 04/24/17 0315 9     Pain Loc --      Pain Edu? --      Excl. in GC? --  Constitutional: Alert and oriented. Well appearing and in moderate distress. Eyes: Conjunctivae are normal. PERRL. EOMI. Head: Atraumatic. Nose: No congestion/rhinnorhea. Mouth/Throat: Mucous membranes are moist.  Oropharynx non-erythematous. Cardiovascular: Normal rate, regular rhythm. Grossly normal heart sounds.  Good peripheral circulation. Respiratory: Normal respiratory effort.  No retractions. Lungs CTAB. Gastrointestinal: Soft with some mild lower abdominal tenderness to palpation. No distention.  Positive bowel sounds Genitourinary: Normal external genitalia with no significant blood in the vaginal vault cervix is closed with no cervical motion tenderness to palpation patient has midline and adnexal  tenderness to palpation no significant discharge. Musculoskeletal: No lower extremity tenderness nor edema.   Neurologic:  Normal speech and language. Skin:  Skin is warm, dry and intact.  Psychiatric: Mood and affect are normal.   ____________________________________________   LABS (all labs ordered are listed, but only abnormal results are displayed)  Labs Reviewed  WET PREP, GENITAL - Abnormal; Notable for the following components:      Result Value   WBC, Wet Prep HPF POC MODERATE (*)    All other components within normal limits  URINALYSIS, COMPLETE (UACMP) WITH MICROSCOPIC - Abnormal; Notable for the following components:   Color, Urine YELLOW (*)    APPearance CLOUDY (*)    Hgb urine dipstick MODERATE (*)    Protein, ur 30 (*)    Leukocytes, UA SMALL (*)    Squamous Epithelial / LPF 6-30 (*)    All other components within normal limits  HCG, QUANTITATIVE, PREGNANCY - Abnormal; Notable for the following components:   hCG, Beta Chain, Quant, S 199,370 (*)    All other components within normal limits  PREGNANCY, URINE - Abnormal; Notable for the following components:   Preg Test, Ur POSITIVE (*)    All other components within normal limits  POCT PREGNANCY, URINE - Abnormal; Notable for the following components:   Preg Test, Ur POSITIVE (*)    All other components within normal limits  CHLAMYDIA/NGC RT PCR ([facility] ONLY)  CBC  ABO/RH   ____________________________________________  EKG  none ____________________________________________  RADIOLOGY  [place] Ob Comp Less 14 Wks  Result Date: 04/24/2017 CLINICAL DATA:  Acute onset of vaginal bleeding and lower abdominal cramping. EXAM: OBSTETRIC <14 WK ULTRASOUND TECHNIQUE: Transabdominal ultrasound was performed for evaluation of the gestation as well as the maternal uterus and adnexal regions. COMPARISON:  Pelvic ultrasound performed 03/05/2017 FINDINGS: Intrauterine gestational sac: Single; visualized and normal in  shape. Yolk sac:  No Embryo:  Yes Cardiac Activity: Yes Heart Rate: 153 bpm CRL:   5.7 cm   12 w 2 d                  US EDC: 11/04/2017 Subchorionic hemorrhage:  None visualized. Maternal uterus/adnexae: The uterus is otherwise unremarkable. The ovaries are within normal limits. The right ovary measures 3.9 x 2.3 x 2.8 cm, while the left ovary measures 2.7 x 2.3 x 1.7 cm. No suspicious adnexal masses are seen; there is no evidence for ovarian torsion. No free fluid is seen within the pelvic cul-de-sac. IMPRESSION: Single live intrauterine pregnancy noted, with a crown-rump length of 5.7 cm, corresponding to a gestational age of [redacted] weeks 2 days. This matches the gestational age of [redacted] weeks 1 day by the first ultrasound, reflecting an estimated date of delivery of Nov 05, 2017. Electronically Signed   By: Roanna RaiderJeffery  Chang M.D.   On: 04/24/2017 06:49    ____________________________________________   PROCEDURES  Procedure(s) performed: None  Procedures  Critical  Care performed: No  ____________________________________________   INITIAL IMPRESSION / ASSESSMENT AND PLAN / ED COURSE  As part of my medical decision making, I reviewed the following data within the electronic MEDICAL RECORD NUMBER Notes from prior ED visits and [place] Controlled Substance Database   This is a 21 year old who comes into the hospital today with some lower abdominal pain and bleeding.  The patient is [redacted] weeks pregnant.  My differential diagnosis includes miscarriage versus subchorionic hemorrhage versus other cause of her hemorrhage.  The patient had some blood work done and it was unremarkable.  I will send the patient for an ultrasound to evaluate her pregnancy.     The patient's ultrasound shows viable fetus with a heart rate of 153.  At this time the patient has a threatened miscarriage.  She will be discharged to follow-up with her OB/GYN.  She should continue taking Tylenol for pain at  home. ____________________________________________   FINAL CLINICAL IMPRESSION(S) / ED DIAGNOSES  Final diagnoses:  Threatened miscarriage  Lower abdominal pain  Vaginal bleeding in pregnancy     ED Discharge Orders    None       Note:  This document was prepared using Dragon voice recognition software and may include unintentional dictation errors.    Rebecka Apley, MD 04/24/17 [phone]

## 2017-04-24 NOTE — ED Notes (Signed)
Patient transported to Ultrasound 

## 2017-04-24 NOTE — ED Triage Notes (Signed)
Patient ambulatory to triage with steady gait, without difficulty or distress noted; pt reports abd cramping and vag bleeding tonight; approx [redacted]wks pregnant, pt at [facility] G2P1; seen yesterday morning for bleeding but has increased[facility]

## 2017-04-24 NOTE — Discharge Instructions (Signed)
PLease follow up with your OB/GYN for further evaluation

## 2017-04-25 ENCOUNTER — Encounter: Payer: Self-pay | Admitting: Certified Nurse Midwife

## 2017-04-26 LAB — PAP IG, CT-NG, RFX HPV ASCU
CHLAMYDIA, NUC. ACID AMP: NEGATIVE
Gonococcus by Nucleic Acid Amp: NEGATIVE
PAP SMEAR COMMENT: 0

## 2017-04-30 ENCOUNTER — Encounter: Payer: Self-pay | Admitting: Certified Nurse Midwife

## 2017-05-17 ENCOUNTER — Telehealth: Payer: Self-pay | Admitting: *Deleted

## 2017-05-17 NOTE — Telephone Encounter (Signed)
Patient called and states she [redacted] wks pregnant and  is having pressure when she is walking and only when she is walking. Patient wants to know what to do about the pressure. Patient is requesting a call back . Her contact number is [phone]. Please advise. Thank you

## 2017-05-18 NOTE — Telephone Encounter (Signed)
Attempted to call pt- unable to leave a message due to mailbox being full.

## 2017-05-21 ENCOUNTER — Encounter: Payer: Self-pay | Admitting: Certified Nurse Midwife

## 2017-05-21 ENCOUNTER — Other Ambulatory Visit: Payer: Self-pay

## 2017-05-22 ENCOUNTER — Telehealth: Payer: Self-pay | Admitting: Certified Nurse Midwife

## 2017-05-22 NOTE — Telephone Encounter (Signed)
Pt stated that she is still having a lot of pressure when she walks and wasn't sure if she should have an ultrasound. Pt is requesting a call back to discuss what she should do. CB# [phone] Please advise. Thanks TNP

## 2017-05-23 ENCOUNTER — Encounter: Payer: Self-pay | Admitting: Obstetrics and Gynecology

## 2017-05-25 ENCOUNTER — Ambulatory Visit (INDEPENDENT_AMBULATORY_CARE_PROVIDER_SITE_OTHER): Payer: Medicaid Other | Admitting: Certified Nurse Midwife

## 2017-05-25 ENCOUNTER — Other Ambulatory Visit: Payer: Medicaid Other

## 2017-05-25 ENCOUNTER — Encounter: Payer: Self-pay | Admitting: Certified Nurse Midwife

## 2017-05-25 ENCOUNTER — Other Ambulatory Visit: Payer: Self-pay

## 2017-05-25 VITALS — BP 102/74 | HR 88 | Wt 195.4 lb

## 2017-05-25 DIAGNOSIS — Z3491 Encounter for supervision of normal pregnancy, unspecified, first trimester: Secondary | ICD-10-CM

## 2017-05-25 DIAGNOSIS — Z131 Encounter for screening for diabetes mellitus: Secondary | ICD-10-CM

## 2017-05-25 DIAGNOSIS — Z6838 Body mass index (BMI) 38.0-38.9, adult: Secondary | ICD-10-CM

## 2017-05-25 DIAGNOSIS — Z3482 Encounter for supervision of other normal pregnancy, second trimester: Secondary | ICD-10-CM

## 2017-05-25 LAB — POCT URINALYSIS DIPSTICK
BILIRUBIN UA: NEGATIVE
Blood, UA: NEGATIVE
Glucose, UA: NEGATIVE
KETONES UA: NEGATIVE
NITRITE UA: NEGATIVE
Odor: NEGATIVE
PH UA: 6 (ref 5.0–8.0)
Spec Grav, UA: 1.025 (ref 1.010–1.025)
UROBILINOGEN UA: 0.2 U/dL

## 2017-05-25 NOTE — Progress Notes (Signed)
ROB, doing well. 1 hr early gtt today. Anticipatory guidance for 20 wk U/s at next visit.   Doreene BurkeAnnie Koralynn Greenspan, CNM

## 2017-05-25 NOTE — Progress Notes (Signed)
ROB- early gtt. Declines flu vaccine.

## 2017-05-25 NOTE — Patient Instructions (Signed)

## 2017-05-26 LAB — GLUCOSE, 1 HOUR GESTATIONAL: GESTATIONAL DIABETES SCREEN: 153 mg/dL — AB (ref 65–139)

## 2017-05-29 ENCOUNTER — Encounter: Payer: Self-pay | Admitting: Certified Nurse Midwife

## 2017-05-29 ENCOUNTER — Other Ambulatory Visit: Payer: Self-pay | Admitting: Certified Nurse Midwife

## 2017-05-29 DIAGNOSIS — Z369 Encounter for antenatal screening, unspecified: Secondary | ICD-10-CM

## 2017-05-29 DIAGNOSIS — O99810 Abnormal glucose complicating pregnancy: Secondary | ICD-10-CM

## 2017-05-30 ENCOUNTER — Encounter: Payer: Self-pay | Admitting: Obstetrics and Gynecology

## 2017-05-30 ENCOUNTER — Other Ambulatory Visit: Payer: Self-pay

## 2017-06-07 ENCOUNTER — Other Ambulatory Visit: Payer: Self-pay

## 2017-06-11 ENCOUNTER — Other Ambulatory Visit: Payer: Medicaid Other

## 2017-06-11 ENCOUNTER — Ambulatory Visit (INDEPENDENT_AMBULATORY_CARE_PROVIDER_SITE_OTHER): Payer: Medicaid Other | Admitting: Certified Nurse Midwife

## 2017-06-11 DIAGNOSIS — Z3491 Encounter for supervision of normal pregnancy, unspecified, first trimester: Secondary | ICD-10-CM

## 2017-06-11 DIAGNOSIS — Z202 Contact with and (suspected) exposure to infections with a predominantly sexual mode of transmission: Secondary | ICD-10-CM

## 2017-06-11 DIAGNOSIS — Z3482 Encounter for supervision of other normal pregnancy, second trimester: Secondary | ICD-10-CM

## 2017-06-11 NOTE — Patient Instructions (Signed)

## 2017-06-11 NOTE — Progress Notes (Signed)
ROB, doing well. 3 hr GTT today. Anatomy u/s scheduled for next week. She is feeling movement. Will follow up in 1 wk.   Doreene BurkeAnnie Amaya Blakeman, CNM

## 2017-06-13 ENCOUNTER — Other Ambulatory Visit: Payer: Self-pay | Admitting: Certified Nurse Midwife

## 2017-06-13 DIAGNOSIS — Z369 Encounter for antenatal screening, unspecified: Secondary | ICD-10-CM

## 2017-06-13 LAB — MONITOR DRUG PROFILE 14(MW)
Amphetamine Scrn, Ur: NEGATIVE ng/mL
BARBITURATE SCREEN URINE: NEGATIVE ng/mL
BENZODIAZEPINE SCREEN, URINE: NEGATIVE ng/mL
BUPRENORPHINE, URINE: NEGATIVE ng/mL
CANNABINOIDS UR QL SCN: NEGATIVE ng/mL
COCAINE(METAB.)SCREEN, URINE: NEGATIVE ng/mL
CREATININE(CRT), U: 93 mg/dL (ref 20.0–300.0)
Fentanyl, Urine: NEGATIVE pg/mL
MEPERIDINE SCREEN, URINE: NEGATIVE ng/mL
METHADONE SCREEN, URINE: NEGATIVE ng/mL
OXYCODONE+OXYMORPHONE UR QL SCN: NEGATIVE ng/mL
Opiate Scrn, Ur: NEGATIVE ng/mL
PHENCYCLIDINE QUANTITATIVE URINE: NEGATIVE ng/mL
Ph of Urine: 6.5 (ref 4.5–8.9)
Propoxyphene Scrn, Ur: NEGATIVE ng/mL
SPECIFIC GRAVITY: 1.011
Tramadol Screen, Urine: NEGATIVE ng/mL

## 2017-06-14 ENCOUNTER — Encounter: Payer: Self-pay | Admitting: Certified Nurse Midwife

## 2017-06-14 ENCOUNTER — Telehealth: Payer: Self-pay

## 2017-06-14 ENCOUNTER — Telehealth: Payer: Self-pay | Admitting: *Deleted

## 2017-06-14 LAB — GESTATIONAL GLUCOSE TOLERANCE
GLUCOSE 1 HOUR GTT: 189 mg/dL — AB (ref 65–179)
Glucose, Fasting: 92 mg/dL (ref 65–94)
Glucose, GTT - 2 Hour: 150 mg/dL (ref 65–154)
Glucose, GTT - 3 Hour: 92 mg/dL (ref 65–139)

## 2017-06-14 LAB — HSV 1 AND 2 IGM ABS, INDIRECT: HSV 2 IgM: <1:10 {titer}

## 2017-06-14 LAB — HSV(HERPES SIMPLEX VRS) I + II AB-IGG
HSV 1 Glycoprotein G Ab, IgG: 29.8 index — ABNORMAL HIGH (ref 0.00–0.90)
HSV 2 IGG, TYPE SPEC: 6.39 {index} — AB (ref 0.00–0.90)

## 2017-06-14 NOTE — Telephone Encounter (Signed)
Voicemail message left- returning pts call

## 2017-06-14 NOTE — Telephone Encounter (Signed)
Patient called and is inquiring about her lab results. Please call . Contact # is [phone]. Please advise. Thank you

## 2017-06-14 NOTE — Telephone Encounter (Signed)
Pt was inquiring about test results- results not in office yet. Will call pt after review.

## 2017-06-16 ENCOUNTER — Encounter: Payer: Self-pay | Admitting: Certified Nurse Midwife

## 2017-06-18 ENCOUNTER — Ambulatory Visit (INDEPENDENT_AMBULATORY_CARE_PROVIDER_SITE_OTHER): Payer: Medicaid Other

## 2017-06-18 DIAGNOSIS — Z369 Encounter for antenatal screening, unspecified: Secondary | ICD-10-CM

## 2017-06-18 DIAGNOSIS — Z3482 Encounter for supervision of other normal pregnancy, second trimester: Secondary | ICD-10-CM | POA: Diagnosis not present

## 2017-06-22 ENCOUNTER — Encounter: Payer: Self-pay | Admitting: Certified Nurse Midwife

## 2017-06-22 ENCOUNTER — Other Ambulatory Visit: Payer: Self-pay

## 2017-06-23 ENCOUNTER — Encounter: Payer: Self-pay | Admitting: Certified Nurse Midwife

## 2017-07-02 ENCOUNTER — Encounter: Payer: Self-pay | Admitting: Certified Nurse Midwife

## 2017-07-11 ENCOUNTER — Encounter: Payer: Self-pay | Admitting: Certified Nurse Midwife

## 2017-07-11 ENCOUNTER — Ambulatory Visit (INDEPENDENT_AMBULATORY_CARE_PROVIDER_SITE_OTHER): Payer: Medicaid Other | Admitting: Certified Nurse Midwife

## 2017-07-11 VITALS — BP 101/74 | HR 92 | Wt 203.4 lb

## 2017-07-11 DIAGNOSIS — Z3482 Encounter for supervision of other normal pregnancy, second trimester: Secondary | ICD-10-CM

## 2017-07-11 LAB — POCT URINALYSIS DIPSTICK
Bilirubin, UA: NEGATIVE
Blood, UA: NEGATIVE
Glucose, UA: NEGATIVE
Ketones, UA: NEGATIVE
Leukocytes, UA: NEGATIVE
Nitrite, UA: NEGATIVE
Protein, UA: NEGATIVE
Spec Grav, UA: 1.015
Urobilinogen, UA: 0.2 U/dL
pH, UA: 6.5

## 2017-07-11 NOTE — Patient Instructions (Signed)

## 2017-07-11 NOTE — Progress Notes (Signed)
Pt is here for an ROB visit. States she is uncomfortable. Went to ED in [place]- having pains and was vomiting on 06/30/17.

## 2017-07-11 NOTE — Progress Notes (Signed)
ROB, doing well. No complaints. She states she was at ED 1/19 for braxton hicks and N&V. Discussed po hydration in relation to PTL . Anticipatory guidance for 28 wk visit . She request to do 1 hr GTT and not go straight to 3 hr test. Discussed avoiding carbohydrates the morning of testing. She verbalizes understanding. She feels good movement. PTL precautions reviewed. Follow up 4 wks.   Doreene BurkeAnnie Ezmae Speers, CNM

## 2017-07-16 ENCOUNTER — Encounter: Payer: Self-pay | Admitting: Certified Nurse Midwife

## 2017-07-16 ENCOUNTER — Other Ambulatory Visit: Payer: Self-pay | Admitting: *Deleted

## 2017-07-16 MED ORDER — ACYCLOVIR 400 MG PO TABS: 400.0000 mg | ORAL_TABLET | Freq: Two times a day (BID) | ORAL | 2 refills | Status: AC

## 2017-07-23 ENCOUNTER — Encounter: Payer: Self-pay | Admitting: Certified Nurse Midwife

## 2017-07-23 ENCOUNTER — Telehealth: Payer: Self-pay | Admitting: Certified Nurse Midwife

## 2017-07-23 NOTE — Telephone Encounter (Signed)
Pt called to discussed pain and PTL labor precautions. Reviewed Fetal movement and encouraged to eat, drink and lay on her side and pay attention to fetal movement . IF concerned about movement she was instructed to go to hospital for further evaluation.   Doreene BurkeAnnie Aditya Nastasi, CNM

## 2017-07-26 ENCOUNTER — Encounter: Payer: Self-pay | Admitting: Certified Nurse Midwife

## 2017-07-27 ENCOUNTER — Telehealth: Payer: Self-pay | Admitting: Certified Nurse Midwife

## 2017-07-27 NOTE — Telephone Encounter (Signed)
Patient called requesting an ultrasound to weigh the baby. She states that her stomach feel heavy. She is asking what can she do to get an ultrasound. Please advise.

## 2017-07-30 NOTE — Telephone Encounter (Signed)
Caller not available at this time.  

## 2017-07-31 NOTE — Telephone Encounter (Signed)
Caller not available at this time.

## 2017-08-01 NOTE — Telephone Encounter (Signed)
Pt aware she will need to discuss with provider at [place].

## 2017-08-06 ENCOUNTER — Telehealth: Payer: Self-pay

## 2017-08-06 ENCOUNTER — Telehealth: Payer: Self-pay | Admitting: Certified Nurse Midwife

## 2017-08-06 NOTE — Telephone Encounter (Signed)
Patient called stating that we diagnosed her with depression and she needs to be on medication. Please Advise

## 2017-08-09 ENCOUNTER — Other Ambulatory Visit: Payer: Self-pay

## 2017-08-09 ENCOUNTER — Encounter: Payer: Self-pay | Admitting: Certified Nurse Midwife

## 2017-09-01 ENCOUNTER — Other Ambulatory Visit: Payer: Self-pay

## 2017-09-01 ENCOUNTER — Encounter (HOSPITAL_BASED_OUTPATIENT_CLINIC_OR_DEPARTMENT_OTHER): Payer: Self-pay | Admitting: Emergency Medicine

## 2017-09-01 ENCOUNTER — Emergency Department (HOSPITAL_BASED_OUTPATIENT_CLINIC_OR_DEPARTMENT_OTHER)
Admission: EM | Admit: 2017-09-01 | Discharge: 2017-09-01 | Disposition: A | Discharge status: Home / Self Care | Payer: Medicaid Other | Attending: Emergency Medicine | Admitting: Emergency Medicine

## 2017-09-01 DIAGNOSIS — O98319 Other infections with a predominantly sexual mode of transmission complicating pregnancy, unspecified trimester: Secondary | ICD-10-CM | POA: Insufficient documentation

## 2017-09-01 DIAGNOSIS — R1030 Lower abdominal pain, unspecified: Secondary | ICD-10-CM

## 2017-09-01 DIAGNOSIS — O9989 Other specified diseases and conditions complicating pregnancy, childbirth and the puerperium: Secondary | ICD-10-CM | POA: Insufficient documentation

## 2017-09-01 DIAGNOSIS — O2313 Infections of bladder in pregnancy, third trimester: Secondary | ICD-10-CM | POA: Diagnosis not present

## 2017-09-01 DIAGNOSIS — Z3A31 31 weeks gestation of pregnancy: Secondary | ICD-10-CM | POA: Diagnosis not present

## 2017-09-01 DIAGNOSIS — N3000 Acute cystitis without hematuria: Secondary | ICD-10-CM

## 2017-09-01 DIAGNOSIS — B009 Herpesviral infection, unspecified: Secondary | ICD-10-CM | POA: Insufficient documentation

## 2017-09-01 LAB — MAGNESIUM: MAGNESIUM: 1.9 mg/dL (ref 1.7–2.4)

## 2017-09-01 LAB — CBC WITH DIFFERENTIAL/PLATELET
Basophils Absolute: 0 10*3/uL (ref 0.0–0.1)
Basophils Relative: 0 %
EOS ABS: 0.1 10*3/uL (ref 0.0–0.7)
EOS PCT: 0 %
HCT: 33 % — ABNORMAL LOW (ref 36.0–46.0)
Hemoglobin: 10.7 g/dL — ABNORMAL LOW (ref 12.0–15.0)
Lymphocytes Relative: 20 %
Lymphs Abs: 2.5 10*3/uL (ref 0.7–4.0)
MCH: 27.3 pg (ref 26.0–34.0)
MCHC: 32.4 g/dL (ref 30.0–36.0)
MCV: 84.2 fL (ref 78.0–100.0)
MONOS PCT: 6 %
Monocytes Absolute: 0.8 10*3/uL (ref 0.1–1.0)
Neutro Abs: 9.6 10*3/uL — ABNORMAL HIGH (ref 1.7–7.7)
Neutrophils Relative %: 74 %
PLATELETS: 352 10*3/uL (ref 150–400)
RBC: 3.92 MIL/uL (ref 3.87–5.11)
RDW: 13.4 % (ref 11.5–15.5)
WBC: 13 10*3/uL — ABNORMAL HIGH (ref 4.0–10.5)

## 2017-09-01 LAB — URINALYSIS, ROUTINE W REFLEX MICROSCOPIC
Bilirubin Urine: NEGATIVE
GLUCOSE, UA: NEGATIVE mg/dL
HGB URINE DIPSTICK: NEGATIVE
Ketones, ur: 15 mg/dL — AB
Nitrite: NEGATIVE
Protein, ur: NEGATIVE mg/dL
Specific Gravity, Urine: 1.02 (ref 1.005–1.030)
pH: 6 (ref 5.0–8.0)

## 2017-09-01 LAB — BASIC METABOLIC PANEL
Anion gap: 10 (ref 5–15)
BUN: 5 mg/dL — AB (ref 6–20)
CHLORIDE: 105 mmol/L (ref 101–111)
CO2: 20 mmol/L — AB (ref 22–32)
CREATININE: 0.55 mg/dL (ref 0.44–1.00)
Calcium: 9.3 mg/dL (ref 8.9–10.3)
GFR calc Af Amer: >60 mL/min (ref >60)
GFR calc non Af Amer: >60 mL/min (ref >60)
GLUCOSE: 121 mg/dL — AB (ref 65–99)
Potassium: 3.4 mmol/L — ABNORMAL LOW (ref 3.5–5.1)
Sodium: 135 mmol/L (ref 135–145)

## 2017-09-01 LAB — URINALYSIS, MICROSCOPIC (REFLEX): RBC / HPF: NONE SEEN RBC/hpf (ref 0–5)

## 2017-09-01 MED ORDER — SODIUM CHLORIDE 0.9 % IV BOLUS (SEPSIS)
1000.0000 mL | Freq: Once | INTRAVENOUS | Status: AC
  Administered 2017-09-01: 1000 mL via INTRAVENOUS

## 2017-09-01 MED ORDER — CEPHALEXIN 500 MG PO CAPS: 500.0000 mg | ORAL_CAPSULE | Freq: Two times a day (BID) | ORAL | 0 refills | Status: AC

## 2017-09-01 NOTE — ED Provider Notes (Signed)
Emergency Department Provider Note   I have reviewed the triage vital signs and the nursing notes.   HISTORY  Chief Complaint Heartburn and Pelvic pressure ([redacted] weeks pregnant)   HPI Paige Alexander is a 22 y.o. female to the emergency department for evaluation of persistent supra pubic abdominal discomfort.  The patient is currently [redacted] weeks pregnant and [facility].  She states that she went to the emergency department initially with this pain 2 weeks ago and was given medication to stop the discomfort.  Symptoms returned and she followed up with Dr. Allena KatzPatel who reassured her that this was nothing serious.  She states she is continued to have symptoms which are slightly worse.  She denies any dysuria, hesitancy, urgency.  No fevers or chills.  No leakage of watery vaginal fluid or vaginal bleeding.  She does feel some pelvic pressure and some occasional burning midepigastric abdominal discomfort consistent with her reflux.   Past Medical History:  Diagnosis Date  . HSV (herpes simplex virus) infection     Patient Active Problem List   Diagnosis Date Noted  . Depression, major, single episode, moderate (HCC) 03/15/2015  . PTSD (post-traumatic stress disorder) 03/15/2015  . Insomnia 03/15/2015    History reviewed. No pertinent surgical history.  Current Outpatient Rx  . Order #: [id number] Class: Normal  . Order #: [id number] Class: Print  . Order #: [id number] Class: Normal  . Order #: [id number] Class: Print    Allergies Patient has no known allergies.  Family History  Problem Relation Age of Onset  . Breast cancer Neg Hx   . Ovarian cancer Neg Hx   . Colon cancer Neg Hx     Social History Social History   Tobacco Use  . Smoking status: Never Smoker  . Smokeless tobacco: Never Used  Substance Use Topics  . Alcohol use: No  . Drug use: No    Review of Systems  Constitutional: No fever/chills Eyes: No visual changes. ENT:  No sore throat. Cardiovascular: Denies chest pain. Respiratory: Denies shortness of breath. Gastrointestinal: Positive lower abdominal pain and pelvic pressure.  No nausea, no vomiting.  No diarrhea.  No constipation. Genitourinary: Negative for dysuria. Musculoskeletal: Negative for back pain. Skin: Negative for rash. Neurological: Negative for headaches, focal weakness or numbness.  10-point ROS otherwise negative.  ____________________________________________   PHYSICAL EXAM:  VITAL SIGNS: ED Triage Vitals  Enc Vitals Group     BP 09/01/17 1802 118/81     Pulse Rate 09/01/17 1802 (!) 105     Resp 09/01/17 1802 20     Temp 09/01/17 1802 98.5 F (36.9 C)     Temp Source 09/01/17 1802 Oral     SpO2 09/01/17 1802 100 %     Pain Score 09/01/17 1801 9   Constitutional: Alert and oriented. Well appearing and in no acute distress. Eyes: Conjunctivae are normal. Head: Atraumatic. Nose: No congestion/rhinnorhea. Mouth/Throat: Mucous membranes are moist.   Neck: No stridor.  Cardiovascular: Normal rate, regular rhythm. Good peripheral circulation. Grossly normal heart sounds.   Respiratory: Normal respiratory effort.  No retractions. Lungs CTAB. Gastrointestinal: Soft and gravid with mild suprapubic discomfort. No RLQ or LLQ tenderness to palpation. No distention.  Genitourinary: No vaginal pooling secretions. No vaginal bleeding. Sterile exam shows cervix which is thick, fingertip, and high.  Musculoskeletal: No lower extremity tenderness nor edema. No gross deformities of extremities. Neurologic:  Normal speech and language. No gross focal neurologic deficits are appreciated.  Skin:  Skin is warm, dry and intact. No rash noted.  ____________________________________________   LABS (all labs ordered are listed, but only abnormal results are displayed)  Labs Reviewed  BASIC METABOLIC PANEL - Abnormal; Notable for the following components:      Result Value   Potassium 3.4 (*)     CO2 20 (*)    Glucose, Bld 121 (*)    BUN 5 (*)    All other components within normal limits  CBC WITH DIFFERENTIAL/PLATELET - Abnormal; Notable for the following components:   WBC 13.0 (*)    Hemoglobin 10.7 (*)    HCT 33.0 (*)    Neutro Abs 9.6 (*)    All other components within normal limits  URINALYSIS, ROUTINE W REFLEX MICROSCOPIC - Abnormal; Notable for the following components:   Ketones, ur 15 (*)    Leukocytes, UA SMALL (*)    All other components within normal limits  URINALYSIS, MICROSCOPIC (REFLEX) - Abnormal; Notable for the following components:   Bacteria, UA MANY (*)    Squamous Epithelial / LPF 6-30 (*)    All other components within normal limits  MAGNESIUM   ____________________________________________  RADIOLOGY  None ____________________________________________   PROCEDURES  Procedure(s) performed:   Procedures  None ____________________________________________   INITIAL IMPRESSION / ASSESSMENT AND PLAN / ED COURSE  Pertinent labs & imaging results that were available during my care of the patient were reviewed by me and considered in my medical decision making (see chart for details).  Patient presents to the emergency department with continued suprapubic discomfort.  Worse over the last 3 days but is actually been present for approaching 2 weeks.  She has a cervix which is thick, posterior, and fingertip.  No pooling vaginal fluids or bleeding.  Patient was placed immediately on fetal monitor.  Appreciable contractions on my read but will attempt to transmit this to the OB on call.  Plan for labs, IV fluids, reassessment.  FHM reviewed remotely by OB at [facility]. No contractions. Cat I tracing. Spoke with patient's OB on call, Dr. Cliffton Asters, regarding the case. Some bacterial on UA. No evidence of UTI. Plan for treatment with Keflex after discussion with Dr. Cliffton Asters. Patient labs reviewed further with no other acute findings. Patient to call OB on  Monday for close follow up.   At this time, I do not feel there is any life-threatening condition present. I have reviewed and discussed all results (EKG, imaging, lab, urine as appropriate), exam findings with patient. I have reviewed nursing notes and appropriate previous records.  I feel the patient is safe to be discharged home without further emergent workup. Discussed usual and customary return precautions. Patient and family (if present) verbalize understanding and are comfortable with this plan.  Patient will follow-up with their primary care provider. If they do not have a primary care provider, information for follow-up has been provided to them. All questions have been answered.  ____________________________________________  FINAL CLINICAL IMPRESSION(S) / ED DIAGNOSES  Final diagnoses:  Lower abdominal pain  Acute cystitis without hematuria     MEDICATIONS GIVEN DURING THIS VISIT:  Medications  sodium chloride 0.9 % bolus 1,000 mL (0 mLs Intravenous Stopped 09/01/17 2000)     NEW OUTPATIENT MEDICATIONS STARTED DURING THIS VISIT:  Discharge Medication List as of 09/01/2017  8:18 PM    START taking these medications   Details  cephALEXin (KEFLEX) 500 MG capsule Take 1 capsule (500 mg total) by mouth 2 (two) times daily for  7 days., Starting Sat 09/01/2017, Until Sat 09/08/2017, Print        Note:  This document was prepared using Dragon voice recognition software and may include unintentional dictation errors.  Alona Bene, MD Emergency Medicine    Long, Arlyss Repress, MD 09/02/17 [phone]

## 2017-09-01 NOTE — Progress Notes (Signed)
Spoke with IntelKisha RN. Says Dr. Jacqulyn BathLong checked pt's cervix and she is FT, thk, and posterior.

## 2017-09-01 NOTE — Discharge Instructions (Signed)
You were seen in the ED today with lower abdominal pain. Your labs show a possible urine infection which we are treating with antibiotics. Call Dr. Allena KatzPatel on Monday. Return to the ED with any new or worsening symptoms.

## 2017-09-01 NOTE — ED Triage Notes (Signed)
Pt is [redacted] weeks pregnant, [facility]. Pt c/o heart burn x 3 days and pelvic pressure since yesterday. Denies fluid leakage or discharge.

## 2017-09-01 NOTE — Progress Notes (Signed)
[facility]. Pt is a G3P1 at 30 5/[redacted] weeks gestation presenting with c/o abd pressure since yesterday. Says pt denies vaginal bleeding or leaking of fluid. [facility], Dr. Allena KatzPatel. First baby was a vaginal delivery.

## 2017-09-01 NOTE — ED Notes (Addendum)
Spoke with Corrie DandyMary, Rapid OB [facility], Per Corrie DandyMary pt is not showing any contractions and baby looks good on the monitor. Pt c/o having contractions, "Like my first pregnancy." Dr Jacqulyn BathLong made aware of OB rapid response remarks as well as pt complaints.

## 2017-09-01 NOTE — Progress Notes (Signed)
Spoke with Dr. Jolayne Pantheronstant. Pt is a G3P1 at 30 5/[redacted] weeks gestation with c/o lower abd pressure. No vaginal bleeding or leaking of fluid. Dr. Jacqulyn BathLong checked pt's cervix and she is FT, thk, and posterior. FHR is a category 1 tracing with no uc's. Labs have been drawn. Pt has IVF and a U/A has been sent. Pt is OB cleared and can be dc'd home when she is cleared by the ED.

## 2017-09-01 NOTE — Progress Notes (Signed)
Unable to admit pt into [facility]. [facility] notified.

## 2017-09-01 NOTE — Progress Notes (Signed)
Spoke with IntelKisha RN. Pt is OB cleared.

## 2017-10-10 ENCOUNTER — Telehealth: Payer: Self-pay

## 2017-10-10 NOTE — Telephone Encounter (Signed)
Call transferred from front desk- OB- 36 2/7  Caller states she is having a lot of pp and contractions. She is crying and very upset. Pt was last seen by At on 07/11/2017. She has been receiving her care at [facility]. She had a ROB with them on Monday all normal. She was seen  In the ed at [facility] today for pp this morning. NO lof. Reactive nst. Have ctx q 3-5 but not feeling them.  Cervix unchanged from visit on Monday. Dx with ligamentous strain. Pt was sent home with percocet rx. Advised pt to f/u with providers at [facility] if pain was not relieved with narcotics. Informed pt we do not induce before 39 weeks and we have to have an indication to induce.

## 2017-10-11 IMAGING — US US OB COMP LESS 14 WK
1 series · 14 of 28 positions shown · non-contrast
Comparison: None.

CLINICAL DATA: Lower abdominal pain

EXAM:
OBSTETRIC <14 WK US AND TRANSVAGINAL OB US
TECHNIQUE: Both transabdominal and transvaginal ultrasound examinations were
performed for complete evaluation of the gestation as well as the
maternal uterus, adnexal regions, and pelvic cul-de-sac.
Transvaginal technique was performed to assess early pregnancy.

[Series 1: us ob comp less 14 wk · 0.22mm/px · 14 of 102 slices shown]
[im 4/102]
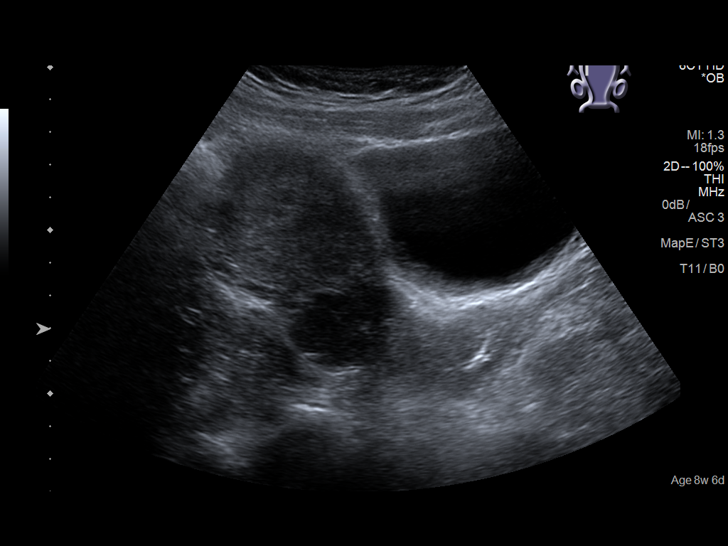
[im 12/102]
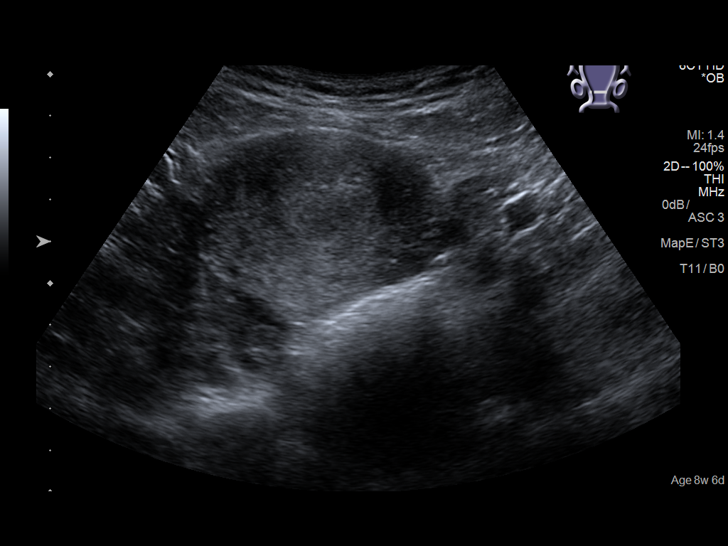
[im 19/102]
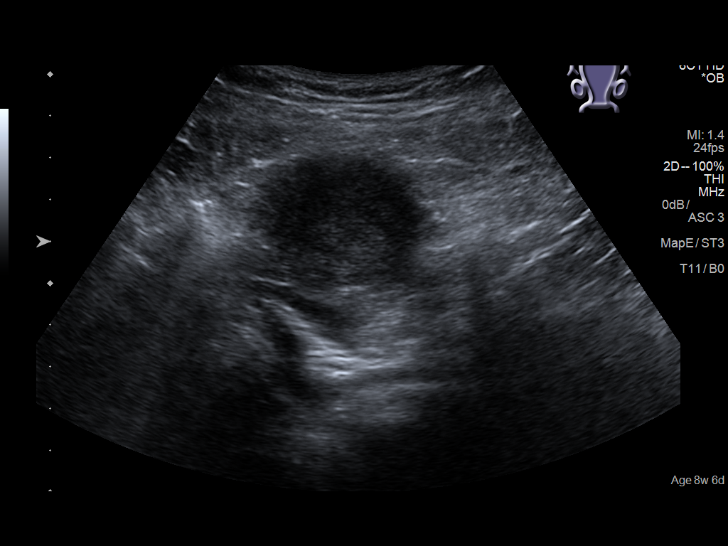
[im 27/102]
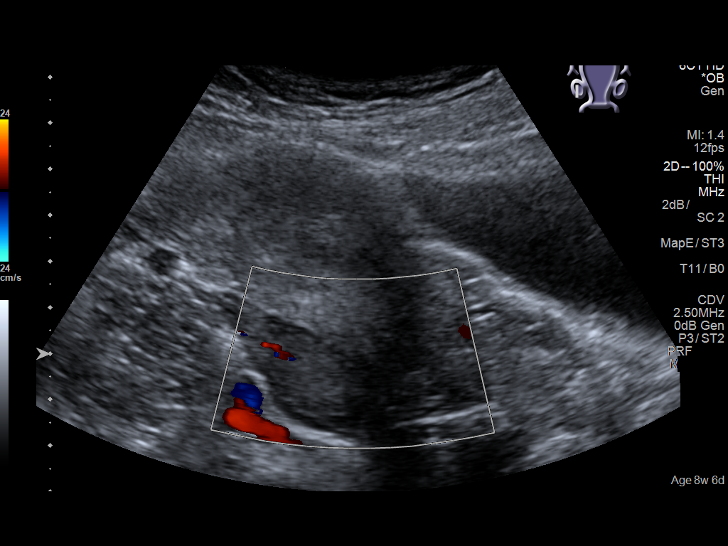
[im 34/102]
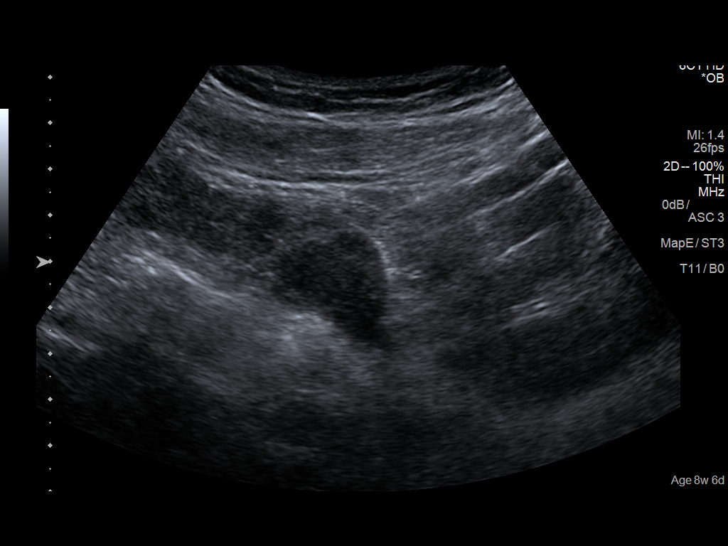
[im 42/102]
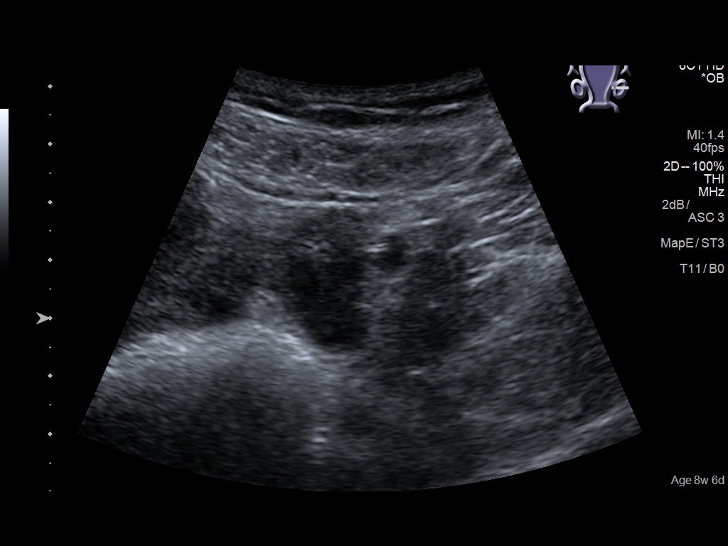
[im 49/102]
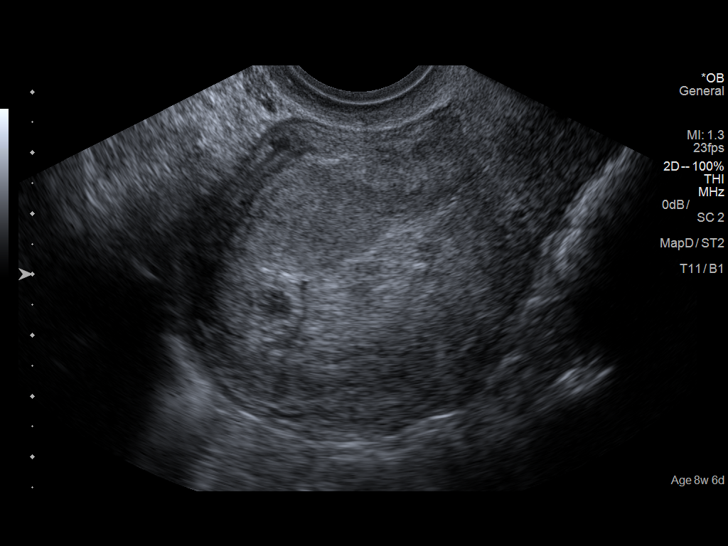
[im 57/102]
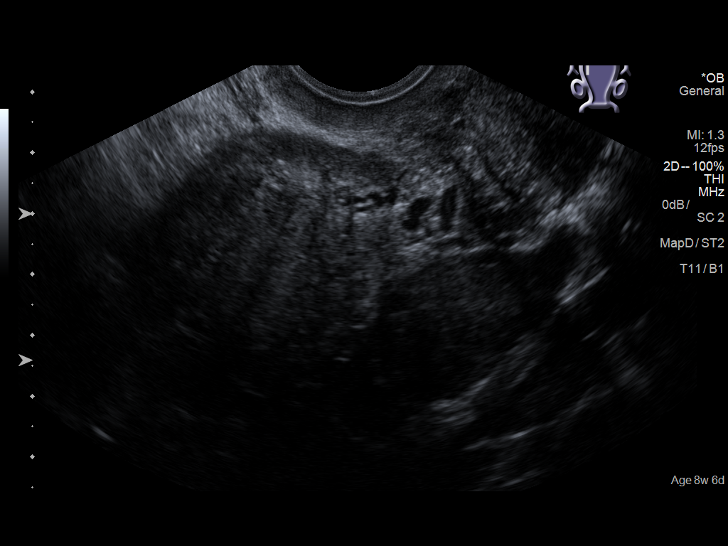
[im 64/102]
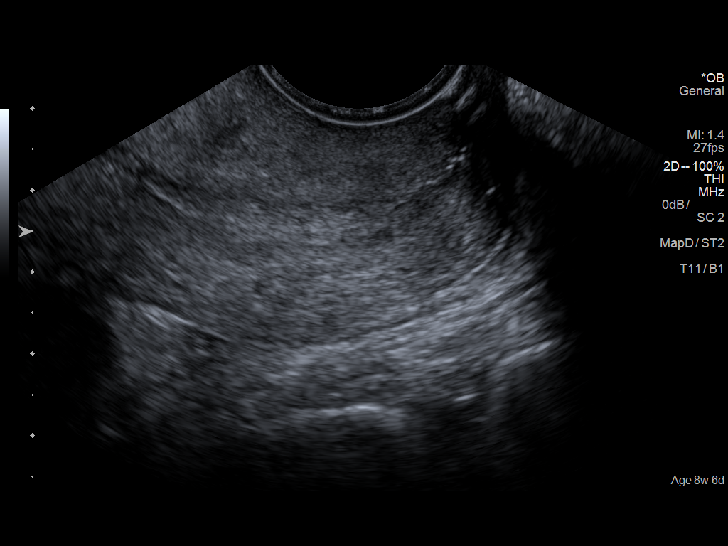
[im 72/102]
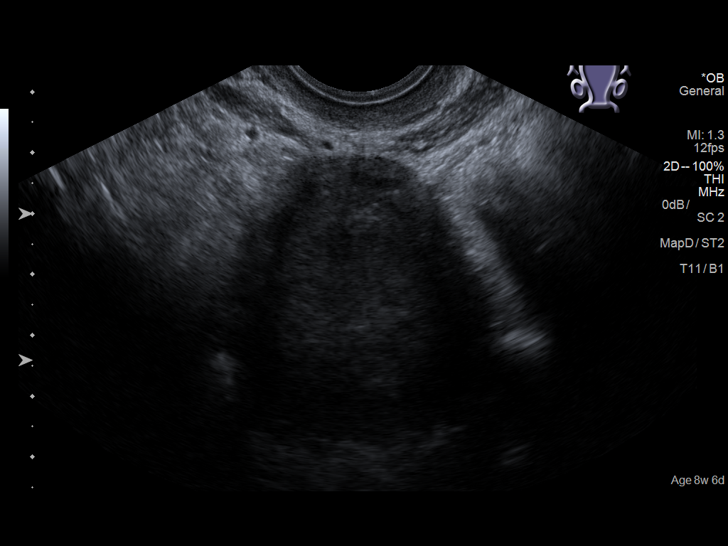
[im 79/102]
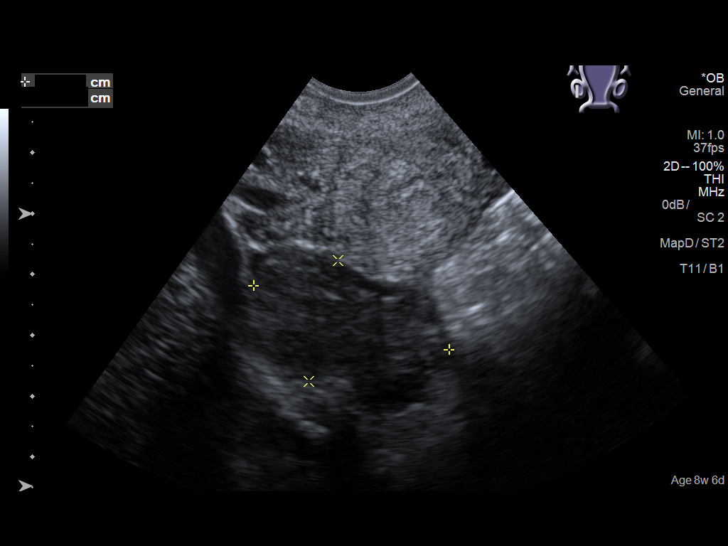
[im 87/102]
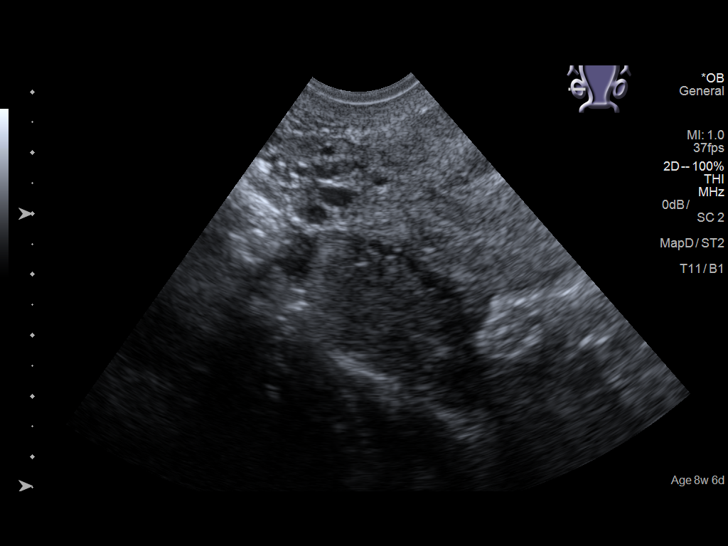
[im 94/102]
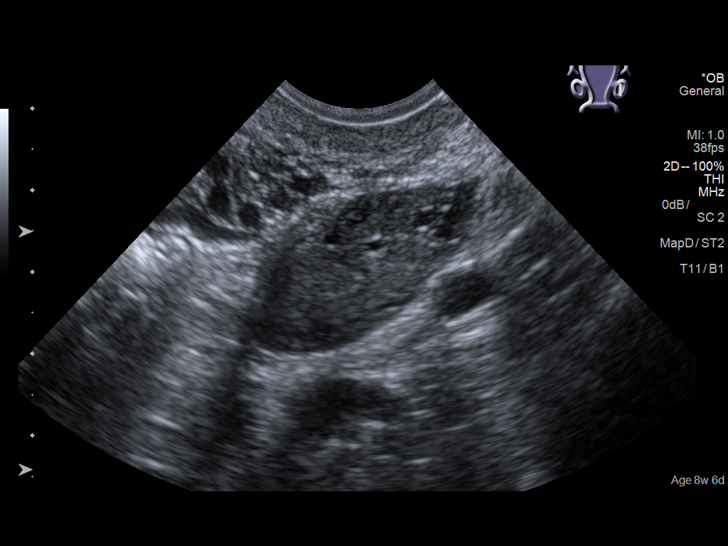
[im 102/102]
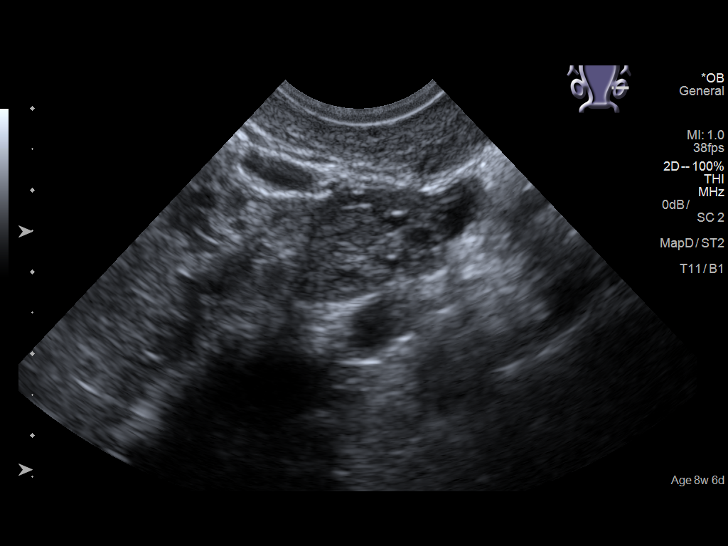

[14 of 28 positions shown; findings below may reference images not displayed]

FINDINGS: Intrauterine gestational sac: Question very early gestational sac

Yolk sac:  Not visualized

Embryo:  Not visualized

Cardiac Activity: Not visualized

Heart Rate:   bpm

MSD: 3.9  mm   5 w   1  d

CRL:    mm    w    d                  US EDC:

Subchorionic hemorrhage:  None visualized.

Maternal uterus/adnexae: No adnexal mass.  No free fluid.
IMPRESSION: Possible very early intrauterine gestation, 5 weeks 1 day by mean
sac diameter. Recommend follow-up in 10-14 days to ensure expected
progression.

## 2018-01-28 ENCOUNTER — Encounter (HOSPITAL_COMMUNITY): Payer: Self-pay
